# Patient Record
Sex: Female | Born: 1970 | Race: White | Hispanic: No | Marital: Single | Smoking: Former smoker
Health system: Southern US, Community
[De-identification: ages and names within clinical notes are randomized; demographics above are authoritative.]

## PROBLEM LIST (undated history)

## (undated) DIAGNOSIS — J45909 Unspecified asthma, uncomplicated: Secondary | ICD-10-CM

## (undated) DIAGNOSIS — M1990 Unspecified osteoarthritis, unspecified site: Secondary | ICD-10-CM

## (undated) HISTORY — DX: Unspecified asthma, uncomplicated: J45.909

## (undated) HISTORY — PX: TUBAL LIGATION: SHX77

## (undated) HISTORY — DX: Unspecified osteoarthritis, unspecified site: M19.90

---

## 2004-11-12 ENCOUNTER — Emergency Department: Payer: Self-pay | Admitting: Emergency Medicine

## 2007-01-30 LAB — HM PAP SMEAR: HM Pap smear: NORMAL

## 2008-09-25 HISTORY — PX: BREAST BIOPSY: SHX20

## 2008-10-28 ENCOUNTER — Ambulatory Visit: Payer: Self-pay | Admitting: Unknown Physician Specialty

## 2010-06-25 LAB — LIPID PANEL
Cholesterol: 166 mg/dL (ref 0–200)
HDL: 54 mg/dL (ref 35–70)
LDL Cholesterol: 98 mg/dL
LDl/HDL Ratio: 1.8
Triglycerides: 72 mg/dL (ref 40–160)

## 2010-06-25 LAB — BASIC METABOLIC PANEL
BUN: 14 mg/dL (ref 4–21)
Creatinine: 0.7 mg/dL (ref 0.5–1.1)
Glucose: 91 mg/dL
Potassium: 4.6 mmol/L (ref 3.4–5.3)
Sodium: 136 mmol/L — AB (ref 137–147)

## 2010-06-25 LAB — HEPATIC FUNCTION PANEL
ALT: 12 U/L (ref 7–35)
AST: 13 U/L (ref 13–35)
Alkaline Phosphatase: 39 U/L (ref 25–125)
Bilirubin, Total: 0.2 mg/dL

## 2010-06-25 LAB — CBC AND DIFFERENTIAL
HCT: 40 % (ref 36–46)
Hemoglobin: 13.6 g/dL (ref 12.0–16.0)
Neutrophils Absolute: 4 /uL
Platelets: 271 10*3/uL (ref 150–399)
WBC: 6.1 10^3/mL

## 2010-06-25 LAB — TSH: TSH: 0.78 u[IU]/mL (ref 0.41–5.90)

## 2010-09-25 HISTORY — PX: ESSURE TUBAL LIGATION: SUR464

## 2011-05-18 ENCOUNTER — Ambulatory Visit: Payer: Self-pay | Admitting: Obstetrics and Gynecology

## 2011-05-23 ENCOUNTER — Other Ambulatory Visit: Payer: Self-pay | Admitting: *Deleted

## 2011-05-23 MED ORDER — ALBUTEROL SULFATE HFA 108 (90 BASE) MCG/ACT IN AERS: 2.0000 | INHALATION_SPRAY | Freq: Four times a day (QID) | RESPIRATORY_TRACT | Status: DC | PRN

## 2011-05-24 NOTE — Telephone Encounter (Signed)
Rx phoned to pharmacy.  

## 2011-08-03 ENCOUNTER — Other Ambulatory Visit: Payer: Self-pay | Admitting: Internal Medicine

## 2011-11-21 ENCOUNTER — Encounter: Payer: Self-pay | Admitting: Internal Medicine

## 2011-11-21 DIAGNOSIS — J45909 Unspecified asthma, uncomplicated: Secondary | ICD-10-CM | POA: Insufficient documentation

## 2011-11-21 DIAGNOSIS — N879 Dysplasia of cervix uteri, unspecified: Secondary | ICD-10-CM | POA: Insufficient documentation

## 2011-11-22 ENCOUNTER — Encounter: Payer: Self-pay | Admitting: Internal Medicine

## 2011-11-22 ENCOUNTER — Ambulatory Visit (INDEPENDENT_AMBULATORY_CARE_PROVIDER_SITE_OTHER): Payer: Managed Care, Other (non HMO) | Admitting: Internal Medicine

## 2011-11-22 DIAGNOSIS — F17200 Nicotine dependence, unspecified, uncomplicated: Secondary | ICD-10-CM

## 2011-11-22 DIAGNOSIS — Z720 Tobacco use: Secondary | ICD-10-CM | POA: Insufficient documentation

## 2011-11-22 DIAGNOSIS — J45909 Unspecified asthma, uncomplicated: Secondary | ICD-10-CM

## 2011-11-22 DIAGNOSIS — Z0000 Encounter for general adult medical examination without abnormal findings: Secondary | ICD-10-CM | POA: Insufficient documentation

## 2011-11-22 LAB — CBC WITH DIFFERENTIAL/PLATELET
Basophils Absolute: 0 10*3/uL (ref 0.0–0.1)
Basophils Relative: 0.4 % (ref 0.0–3.0)
Eosinophils Absolute: 0.2 10*3/uL (ref 0.0–0.7)
Eosinophils Relative: 3.3 % (ref 0.0–5.0)
HCT: 38.6 % (ref 36.0–46.0)
Hemoglobin: 13 g/dL (ref 12.0–15.0)
Lymphocytes Relative: 22.7 % (ref 12.0–46.0)
Lymphs Abs: 1.5 10*3/uL (ref 0.7–4.0)
MCHC: 33.7 g/dL (ref 30.0–36.0)
MCV: 91.2 fl (ref 78.0–100.0)
Monocytes Absolute: 0.4 10*3/uL (ref 0.1–1.0)
Monocytes Relative: 5.2 % (ref 3.0–12.0)
Neutro Abs: 4.7 10*3/uL (ref 1.4–7.7)
Neutrophils Relative %: 68.4 % (ref 43.0–77.0)
Platelets: 262 10*3/uL (ref 150.0–400.0)
RBC: 4.23 Mil/uL (ref 3.87–5.11)
RDW: 13 % (ref 11.5–14.6)
WBC: 6.8 10*3/uL (ref 4.5–10.5)

## 2011-11-22 LAB — COMPREHENSIVE METABOLIC PANEL
ALT: 20 U/L (ref 0–35)
AST: 18 U/L (ref 0–37)
Albumin: 3.5 g/dL (ref 3.5–5.2)
Alkaline Phosphatase: 36 U/L — ABNORMAL LOW (ref 39–117)
BUN: 12 mg/dL (ref 6–23)
CO2: 20 mEq/L (ref 19–32)
Calcium: 8.6 mg/dL (ref 8.4–10.5)
Chloride: 110 mEq/L (ref 96–112)
Creatinine, Ser: 0.7 mg/dL (ref 0.4–1.2)
GFR: 106.27 mL/min (ref >60.00)
Glucose, Bld: 88 mg/dL (ref 70–99)
Potassium: 4.2 mEq/L (ref 3.5–5.1)
Sodium: 137 mEq/L (ref 135–145)
Total Bilirubin: 0.3 mg/dL (ref 0.3–1.2)
Total Protein: 6.9 g/dL (ref 6.0–8.3)

## 2011-11-22 LAB — LIPID PANEL
Cholesterol: 155 mg/dL (ref 0–200)
HDL: 58.3 mg/dL (ref >39.00)
LDL Cholesterol: 85 mg/dL (ref 0–99)
Total CHOL/HDL Ratio: 3
Triglycerides: 60 mg/dL (ref 0.0–149.0)
VLDL: 12 mg/dL (ref 0.0–40.0)

## 2011-11-22 MED ORDER — ALBUTEROL SULFATE HFA 108 (90 BASE) MCG/ACT IN AERS: 2.0000 | INHALATION_SPRAY | Freq: Four times a day (QID) | RESPIRATORY_TRACT | Status: DC | PRN

## 2011-11-22 NOTE — Assessment & Plan Note (Signed)
Strongly encouraged smoking cessation. Pt planning to quit using e-cigarette for nicotine replacement.

## 2011-11-22 NOTE — Progress Notes (Signed)
Subjective:    Patient ID: Hannah Noble, female    DOB: 11/06/1970, 40 y.o.   MRN: [id number]  HPI 41YO female with h/o asthma presents for her annual exam.  Reports she is generally feeling well. She quit smoking last year x 4 months, then resumed. She is planning to quit again, using electronic cigarette for nicotine replacement.  She follows a fairly healthy diet, based on Weight [employer] and is actively trying to lose weight.  She is also trying to increase physical activity. She believes she is UTD on PAP and last was 12/2010 (need records).  Outpatient Encounter Prescriptions as of 11/22/2011  Medication Sig Dispense Refill  . albuterol (PROAIR HFA) 108 (90 BASE) MCG/ACT inhaler Inhale 2 puffs into the lungs every 6 (six) hours as needed for wheezing.  2 Inhaler  4  . DISCONTD: PROAIR HFA 108 (90 BASE) MCG/ACT inhaler USE 4 TIMES A DAY  2 Inhaler  0    Review of Systems  Constitutional: Negative for fever, chills, appetite change, fatigue and unexpected weight change.  HENT: Negative for ear pain, congestion, sore throat, trouble swallowing, neck pain, voice change and sinus pressure.   Eyes: Negative for visual disturbance.  Respiratory: Negative for cough, shortness of breath, wheezing and stridor.   Cardiovascular: Negative for chest pain, palpitations and leg swelling.  Gastrointestinal: Negative for nausea, vomiting, abdominal pain, diarrhea, constipation, blood in stool, abdominal distention and anal bleeding.  Genitourinary: Negative for dysuria and flank pain.  Musculoskeletal: Negative for myalgias, arthralgias and gait problem.  Skin: Negative for color change and rash.  Neurological: Negative for dizziness and headaches.  Hematological: Negative for adenopathy. Does not bruise/bleed easily.  Psychiatric/Behavioral: Negative for suicidal ideas, sleep disturbance and dysphoric mood. The patient is not nervous/anxious.    BP 122/78  Pulse 82  Temp(Src) 97.7 F (36.5 C)  (Oral)  Ht 5' 8.5" (1.74 m)  Wt 230 lb (104.327 kg)  BMI 34.46 kg/m2  SpO2 97%  LMP 11/14/2011     Objective:   Physical Exam  Constitutional: She is oriented to person, place, and time. She appears well-developed and well-nourished. No distress.  HENT:  Head: Normocephalic and atraumatic.  Right Ear: External ear normal.  Left Ear: External ear normal.  Nose: Nose normal.  Mouth/Throat: Oropharynx is clear and moist. No oropharyngeal exudate.  Eyes: Conjunctivae are normal. Pupils are equal, round, and reactive to light. Right eye exhibits no discharge. Left eye exhibits no discharge. No scleral icterus.  Neck: Normal range of motion. Neck supple. No tracheal deviation present. No thyromegaly present.  Cardiovascular: Normal rate, regular rhythm, normal heart sounds and intact distal pulses.  Exam reveals no gallop and no friction rub.   No murmur heard. Pulmonary/Chest: Effort normal and breath sounds normal. No respiratory distress. She has no wheezes. She has no rales. She exhibits no tenderness. Right breast exhibits no inverted nipple, no mass, no nipple discharge, no skin change and no tenderness. Left breast exhibits no inverted nipple, no mass, no nipple discharge, no skin change and no tenderness.    Abdominal: Soft. She exhibits no distension and no mass. There is no tenderness. There is no rebound and no guarding.  Musculoskeletal: Normal range of motion. She exhibits no edema and no tenderness.  Lymphadenopathy:    She has no cervical adenopathy.  Neurological: She is alert and oriented to person, place, and time. No cranial nerve deficit. She exhibits normal muscle tone. Coordination normal.  Skin: Skin is warm and  dry. No rash noted. She is not diaphoretic. No erythema. No pallor.  Psychiatric: She has a normal mood and affect. Her behavior is normal. Judgment and thought content normal.          Assessment & Plan:

## 2011-11-22 NOTE — Assessment & Plan Note (Signed)
Exam including breast exam normal today. Will get records on last PAP, which pt believes was 12/2010 and was normal.  Will get vaccination record.  Will check CBC, CMP, lipids today. Strongly encouraged smoking cessation.  Follow up 6 months and prn.

## 2011-11-22 NOTE — Assessment & Plan Note (Signed)
Made worse recently with tobacco use. Encouraged smoking cessation.  Plan to continue Albuterol prn, as this provides symptomatic relief for her. Follow up 6 months.

## 2011-12-04 ENCOUNTER — Encounter: Payer: Self-pay | Admitting: Internal Medicine

## 2012-01-11 ENCOUNTER — Encounter: Payer: Self-pay | Admitting: Internal Medicine

## 2012-03-13 ENCOUNTER — Other Ambulatory Visit: Payer: Self-pay | Admitting: *Deleted

## 2012-03-13 MED ORDER — ALBUTEROL SULFATE HFA 108 (90 BASE) MCG/ACT IN AERS: 2.0000 | INHALATION_SPRAY | Freq: Four times a day (QID) | RESPIRATORY_TRACT | Status: DC | PRN

## 2012-04-17 ENCOUNTER — Telehealth: Payer: Self-pay | Admitting: *Deleted

## 2012-04-17 MED ORDER — ALBUTEROL SULFATE HFA 108 (90 BASE) MCG/ACT IN AERS: 2.0000 | INHALATION_SPRAY | Freq: Four times a day (QID) | RESPIRATORY_TRACT | Status: DC | PRN

## 2012-04-17 NOTE — Telephone Encounter (Signed)
rx sent to pharmacy

## 2012-05-22 ENCOUNTER — Ambulatory Visit: Payer: Managed Care, Other (non HMO) | Admitting: Internal Medicine

## 2012-05-29 ENCOUNTER — Encounter: Payer: Self-pay | Admitting: Internal Medicine

## 2012-05-29 ENCOUNTER — Ambulatory Visit (INDEPENDENT_AMBULATORY_CARE_PROVIDER_SITE_OTHER): Payer: Managed Care, Other (non HMO) | Admitting: Internal Medicine

## 2012-05-29 VITALS — BP 100/78 | HR 72 | Temp 98.2°F | Ht 68.5 in | Wt 232.5 lb

## 2012-05-29 DIAGNOSIS — F17200 Nicotine dependence, unspecified, uncomplicated: Secondary | ICD-10-CM

## 2012-05-29 DIAGNOSIS — J45909 Unspecified asthma, uncomplicated: Secondary | ICD-10-CM

## 2012-05-29 DIAGNOSIS — Z720 Tobacco use: Secondary | ICD-10-CM

## 2012-05-29 NOTE — Progress Notes (Signed)
Subjective:    Patient ID: Hannah Noble, female    DOB: March 24, 1971, 41 y.o.   MRN: [id number]  HPI 41 year old female with history of asthma and tobacco abuse presents for followup. She reports that after her last visit she quit smoking for a couple of days but then resumed smoking. She is still interested in quitting. She is not interested in trying Chantix because of potential side effects. She does have an e-cigarette that she plans to use to help wean herself off nicotine. She reports that anxiety and stress dealing with her children make quitting smoking difficult. In regards to her asthma, she reports symptoms have been well controlled with only intermittent use of albuterol. She denies any current chest pain, shortness of breath, or chronic cough.  Outpatient Encounter Prescriptions as of 05/29/2012  Medication Sig Dispense Refill  . albuterol (PROAIR HFA) 108 (90 BASE) MCG/ACT inhaler Inhale 2 puffs into the lungs every 6 (six) hours as needed for wheezing.  2 Inhaler  6   BP 100/78  Pulse 72  Temp 98.2 F (36.8 C) (Oral)  Ht 5' 8.5" (1.74 m)  Wt 232 lb 8 oz (105.461 kg)  BMI 34.84 kg/m2  SpO2 98%  LMP 05/03/2012  Review of Systems  Constitutional: Negative for fever, chills, appetite change, fatigue and unexpected weight change.  HENT: Negative for ear pain, congestion, sore throat, trouble swallowing, neck pain, voice change and sinus pressure.   Eyes: Negative for visual disturbance.  Respiratory: Negative for cough, shortness of breath, wheezing and stridor.   Cardiovascular: Negative for chest pain, palpitations and leg swelling.  Gastrointestinal: Negative for nausea, vomiting, abdominal pain, diarrhea, constipation, blood in stool, abdominal distention and anal bleeding.  Genitourinary: Negative for dysuria and flank pain.  Musculoskeletal: Negative for myalgias, arthralgias and gait problem.  Skin: Negative for color change and rash.  Neurological: Negative for  dizziness and headaches.  Hematological: Negative for adenopathy. Does not bruise/bleed easily.  Psychiatric/Behavioral: Negative for suicidal ideas, disturbed wake/sleep cycle and dysphoric mood. The patient is not nervous/anxious.        Objective:   Physical Exam  Constitutional: She is oriented to person, place, and time. She appears well-developed and well-nourished. No distress.  HENT:  Head: Normocephalic and atraumatic.  Right Ear: External ear normal.  Left Ear: External ear normal.  Nose: Nose normal.  Mouth/Throat: Oropharynx is clear and moist. No oropharyngeal exudate.  Eyes: Conjunctivae are normal. Pupils are equal, round, and reactive to light. Right eye exhibits no discharge. Left eye exhibits no discharge. No scleral icterus.  Neck: Normal range of motion. Neck supple. No tracheal deviation present. No thyromegaly present.  Cardiovascular: Normal rate, regular rhythm, normal heart sounds and intact distal pulses.  Exam reveals no gallop and no friction rub.   No murmur heard. Pulmonary/Chest: Effort normal and breath sounds normal. No respiratory distress. She has no wheezes. She has no rales. She exhibits no tenderness.  Musculoskeletal: Normal range of motion. She exhibits no edema and no tenderness.  Lymphadenopathy:    She has no cervical adenopathy.  Neurological: She is alert and oriented to person, place, and time. No cranial nerve deficit. She exhibits normal muscle tone. Coordination normal.  Skin: Skin is warm and dry. No rash noted. She is not diaphoretic. No erythema. No pallor.  Psychiatric: She has a normal mood and affect. Her behavior is normal. Judgment and thought content normal.          Assessment & Plan:

## 2012-05-29 NOTE — Assessment & Plan Note (Signed)
Symptoms are well controlled with intermittent use of albuterol. Will continue. Encouraged smoking cessation.

## 2012-05-29 NOTE — Assessment & Plan Note (Signed)
Encourage smoking cessation. Discussed techniques to help with smoking cessation including use of Wellbutrin and Chantix. We also discussed nicotine replacement. Discussed setting a quit date. Patient will call if she is interested in any of these options.

## 2012-09-16 ENCOUNTER — Telehealth: Payer: Self-pay | Admitting: Internal Medicine

## 2012-09-16 MED ORDER — ALBUTEROL SULFATE HFA 108 (90 BASE) MCG/ACT IN AERS: 2.0000 | INHALATION_SPRAY | Freq: Four times a day (QID) | RESPIRATORY_TRACT | Status: DC | PRN

## 2012-09-16 NOTE — Telephone Encounter (Signed)
Will need to call in Rx.  Maybe Ventolin would be less expensive?

## 2012-09-16 NOTE — Telephone Encounter (Signed)
Called LM with mother regarding medication. Advised her to have pt call back.

## 2012-09-16 NOTE — Telephone Encounter (Signed)
Pt is calling Proair ?? Insurance does 90 day supply and she is on her last one and she can't refill until it until the end of January. She is getting concerned. Please give her a call back or if we have samples.

## 2012-09-16 NOTE — Telephone Encounter (Signed)
Med filled.  

## 2012-09-16 NOTE — Telephone Encounter (Signed)
Please advise we do not have any samples.

## 2012-11-09 ENCOUNTER — Other Ambulatory Visit: Payer: Self-pay

## 2012-11-26 ENCOUNTER — Encounter: Payer: Managed Care, Other (non HMO) | Admitting: Internal Medicine

## 2013-03-04 ENCOUNTER — Telehealth: Payer: Self-pay | Admitting: Internal Medicine

## 2013-03-04 NOTE — Telephone Encounter (Signed)
Needing a sample of pro air

## 2013-03-04 NOTE — Telephone Encounter (Signed)
Left sample of Ventolin up front for patient to pick up.

## 2013-03-11 ENCOUNTER — Ambulatory Visit (INDEPENDENT_AMBULATORY_CARE_PROVIDER_SITE_OTHER): Payer: Managed Care, Other (non HMO) | Admitting: Adult Health

## 2013-03-11 ENCOUNTER — Encounter: Payer: Self-pay | Admitting: Adult Health

## 2013-03-11 VITALS — BP 110/66 | HR 72 | Resp 12 | Wt 241.0 lb

## 2013-03-11 DIAGNOSIS — J45901 Unspecified asthma with (acute) exacerbation: Secondary | ICD-10-CM

## 2013-03-11 DIAGNOSIS — J4541 Moderate persistent asthma with (acute) exacerbation: Secondary | ICD-10-CM

## 2013-03-11 DIAGNOSIS — J45909 Unspecified asthma, uncomplicated: Secondary | ICD-10-CM

## 2013-03-11 DIAGNOSIS — Z720 Tobacco use: Secondary | ICD-10-CM

## 2013-03-11 DIAGNOSIS — J4540 Moderate persistent asthma, uncomplicated: Secondary | ICD-10-CM

## 2013-03-11 DIAGNOSIS — F17200 Nicotine dependence, unspecified, uncomplicated: Secondary | ICD-10-CM

## 2013-03-11 MED ORDER — FLUTICASONE-SALMETEROL 100-50 MCG/DOSE IN AEPB: 1.0000 | INHALATION_SPRAY | Freq: Two times a day (BID) | RESPIRATORY_TRACT | Status: DC

## 2013-03-11 MED ORDER — MONTELUKAST SODIUM 10 MG PO TABS: 10.0000 mg | ORAL_TABLET | Freq: Every day | ORAL | Status: DC

## 2013-03-11 NOTE — Patient Instructions (Addendum)
  I am referring you to a lung specialist for your asthma. They will contact you with an appointment.  I have prescribed a long acting inhaler (Advair) to help better control your symptoms. Use this twice daily - morning and evening. Rinse well after use. If you are having an asthma attack do not use this inhaler. Use your ProAir.  I am also starting you on singulair. Take one at bedtime daily.

## 2013-03-11 NOTE — Progress Notes (Signed)
  Subjective:    Patient ID: Gunnar Fusi, female    DOB: 1970-10-23, 42 y.o.   MRN: [id number]  HPI  Patient is a pleasant 42 year old female who presents to clinic with reports of using her rescue inhaler "too much". She has been using her pro air approximately 6 times daily. Sometimes she awakens in the middle of the night and feels short of breath and uses it then as well. Patient had run out of her inhaler and became very anxious. Patient has a history of asthma however her account is very vague. She first reported that she did not have asthma; however, in reviewing her medical records, asthma is listed as one of her medical problems. She denies having asthma as a child. She reports seeing a physician in the past for pulmonary function tests but believes this was greater than 5 years ago. She does not recall specific information about this test. She recalls that she was on Advair years ago, however, she is currently not on any long-acting inhaler. Patient reports that this spring she experienced severe allergies for the first time. These allergies significantly affected her asthma. She started to take Zyrtec and has felt some improvement pertaining to her allergies. Patient has been a long-time smoker but successfully quit this past January.   Current Outpatient Prescriptions on File Prior to Visit  Medication Sig Dispense Refill  . albuterol (PROAIR HFA) 108 (90 BASE) MCG/ACT inhaler Inhale 2 puffs into the lungs every 6 (six) hours as needed for wheezing.  2 Inhaler  6   No current facility-administered medications on file prior to visit.    Review of Systems  Constitutional: Negative for fever and chills.  HENT: Positive for rhinorrhea and postnasal drip. Negative for sore throat and sinus pressure.   Eyes: Negative.   Respiratory: Positive for cough, shortness of breath and wheezing.   Cardiovascular: Negative for chest pain and palpitations.  Allergic/Immunologic: Positive for  environmental allergies.     BP 110/66  Pulse 72  Resp 12  Wt 241 lb (109.317 kg)  BMI 36.11 kg/m2  SpO2 97%    Objective:   Physical Exam  Constitutional: She is oriented to person, place, and time.  Overweight, pleasant female in no apparent distress.  HENT:  Head: Normocephalic and atraumatic.  Right Ear: External ear normal.  Left Ear: External ear normal.  Nose: Nose normal.  Mouth/Throat: Oropharynx is clear and moist.  Cardiovascular: Normal rate, regular rhythm and normal heart sounds.  Exam reveals no gallop and no friction rub.   No murmur heard. Pulmonary/Chest: Effort normal. No respiratory distress. She has wheezes. She has no rales.  Neurological: She is alert and oriented to person, place, and time.  Skin: Skin is warm and dry.  Psychiatric: She has a normal mood and affect. Her behavior is normal.  Patient, at times, appears to be a poor historian. Vague account of her medical history.          Assessment & Plan:

## 2013-03-11 NOTE — Assessment & Plan Note (Addendum)
Patient's asthma appears to be poorly controlled. Allergies during spring exacerbated symptoms. Increasing use of short-acting inhaler. Start Advair. Provided patient with sample. Instructed to rinse after each use. She has been taking Zyrtec for allergies with good results. Start Singulair. Refer to Pulmonology. Patient has had PFTs greater than 5 years ago. Note, greater than 25 min were spent in face to face communication with patient in the assessment, planning and implementation of care pertaining to this problem.

## 2013-03-11 NOTE — Assessment & Plan Note (Signed)
History of. Patient quit in January 2014. No tobacco use since. Congratulated on significant accomplishment.

## 2013-03-25 ENCOUNTER — Ambulatory Visit (INDEPENDENT_AMBULATORY_CARE_PROVIDER_SITE_OTHER): Payer: Managed Care, Other (non HMO) | Admitting: Pulmonary Disease

## 2013-03-25 ENCOUNTER — Encounter: Payer: Self-pay | Admitting: Pulmonary Disease

## 2013-03-25 VITALS — BP 110/68 | HR 77 | Temp 98.7°F | Ht 69.0 in | Wt 246.0 lb

## 2013-03-25 DIAGNOSIS — F17200 Nicotine dependence, unspecified, uncomplicated: Secondary | ICD-10-CM

## 2013-03-25 DIAGNOSIS — J4540 Moderate persistent asthma, uncomplicated: Secondary | ICD-10-CM

## 2013-03-25 DIAGNOSIS — J45909 Unspecified asthma, uncomplicated: Secondary | ICD-10-CM

## 2013-03-25 DIAGNOSIS — Z720 Tobacco use: Secondary | ICD-10-CM

## 2013-03-25 MED ORDER — FLUNISOLIDE HFA 80 MCG/ACT IN AERS: 2.0000 | INHALATION_SPRAY | Freq: Two times a day (BID) | RESPIRATORY_TRACT | Status: DC

## 2013-03-25 MED ORDER — ALBUTEROL SULFATE HFA 108 (90 BASE) MCG/ACT IN AERS: 2.0000 | INHALATION_SPRAY | Freq: Four times a day (QID) | RESPIRATORY_TRACT | Status: DC | PRN

## 2013-03-25 MED ORDER — ENOXAPARIN SODIUM 30 MG/0.3ML ~~LOC~~ SOLN: 40.0000 mg | Freq: Every day | SUBCUTANEOUS | Status: DC

## 2013-03-25 NOTE — Patient Instructions (Addendum)
We will send you for a Chest X-ray and call you with the results.  Use the Aerospan inhaler 2 puffs twice a day  We will see you back in 4-6 weeks or sooner if needed

## 2013-03-25 NOTE — Assessment & Plan Note (Addendum)
Hannah Noble describes persistent symptoms, at least moderate persistent based on her every other day albuterol use and one to 2 nighttime symptoms a week. I suspect that the years of smoking contributed to the recent escalation in symptoms though I congratulated her on quitting. It is also likely that a URI she contracted in April 2014 contributed as well.  Plan: -Start flunisolide inhaled twice a day -Continue when necessary albuterol -Stay away from cigarettes -Check asthma control questionnaire on next visit. Is still poorly controlled then increase to combination inhaler and consider allergy evaluation versus checking serum IgE level -Check a chest x-ray

## 2013-03-25 NOTE — Progress Notes (Signed)
Subjective:    Patient ID: Hannah Noble, female    DOB: 04-02-1971, 42 y.o.   MRN: [id number]  HPI  This is a 42 year old female who likely has asthma who comes her clinic today to establish care for the same. She had a normal childhood without respiratory illnesses but as a teenager she believes she was diagnosed as having asthma. At some point as a young adult she was placed on Advair which helped with her symptoms. Unfortunately she started smoking as a teenager and smoked for to the one pack per day for at least 20 years and quit in January 2014. Since then she has remained off of all cigarettes. In April 2014 she had an upper respiratory infection while she was visiting friends in [place]. This led to cough, chest tightness, shortness of breath. She also had what she felt like were allergy symptoms at the time. She is seen by an urgent care facility and treated (unclear exactly what she was given) and she started using Zyrtec at this time. She said that this helped with the allergy symptoms. However since April she's had more breathing trouble than normal. She's had use her albuterol multiple times per day and she has nighttime symptoms it least one to 2 times a week requiring her to wake up and use albuterol. Typically, she says that she has to use her albuterol every other day. She was started on Advair prior primary care physician a few weeks ago and she says that this is helped. However she was only given 2 weeks of a sample so she is currently not taking it. Her albuterol use improved greatly while on Advair but then steadily in the last week she's had to use more of it to the point where she is now using albuterol at least every other day. She has had one or 2 nighttime symptoms in the past week as well. She has some wheezing but denies cough or chest pain now. She states that she can't get a deep breath as often as she would like. She denies ongoing sinus symptoms.   Past Medical History   Diagnosis Date  . Asthma      Family History  Problem Relation Age of Onset  . COPD Father   . Cancer Maternal Grandfather     skin  . Cancer Paternal Grandmother     lung     History   Social History  . Marital Status: Divorced    Spouse Name: N/A    Number of Children: 2  . Years of Education: N/A   Occupational History  .  [employer]   Social History Main Topics  . Smoking status: Former Smoker -- 0.50 packs/day    Types: Cigarettes    Quit date: 09/25/2012  . Smokeless tobacco: Not on file  . Alcohol Use: Yes     Comment: Rarely  . Drug Use: No  . Sexually Active: Not on file   Other Topics Concern  . Not on file   Social History Narrative   Lives in [place] with children 14YO and 8YO. Works at [employer] [place].      Regular Exercise -  Yes, walk 1 to 3 times a week 30 min   Daily Caffeine Use:  Diet pepsi 1-2              No Known Allergies   Outpatient Prescriptions Prior to Visit  Medication Sig Dispense Refill  . albuterol (PROAIR HFA) 108 (90  BASE) MCG/ACT inhaler Inhale 2 puffs into the lungs every 6 (six) hours as needed for wheezing.  2 Inhaler  6  . Fluticasone-Salmeterol (ADVAIR) 100-50 MCG/DOSE AEPB Inhale 1 puff into the lungs 2 (two) times daily.  1 each  3  . montelukast (SINGULAIR) 10 MG tablet Take 1 tablet (10 mg total) by mouth at bedtime.  30 tablet  3  . cetirizine (ZYRTEC) 10 MG tablet Take 10 mg by mouth daily.       No facility-administered medications prior to visit.       Review of Systems  Constitutional: Negative for fever, chills, diaphoresis, activity change, appetite change, fatigue and unexpected weight change.  HENT: Positive for congestion, sneezing and sinus pressure. Negative for hearing loss, ear pain, nosebleeds, sore throat, facial swelling, rhinorrhea, mouth sores, trouble swallowing, neck pain, neck stiffness, dental problem, voice change, postnasal drip, tinnitus and ear discharge.   Eyes: Negative  for photophobia, discharge, itching and visual disturbance.  Respiratory: Positive for cough, chest tightness and shortness of breath. Negative for apnea, choking, wheezing and stridor.   Cardiovascular: Negative for chest pain, palpitations and leg swelling.  Gastrointestinal: Negative for nausea, vomiting, abdominal pain, constipation, blood in stool and abdominal distention.  Genitourinary: Negative for dysuria, urgency, frequency, hematuria, flank pain, decreased urine volume and difficulty urinating.  Musculoskeletal: Negative for myalgias, back pain, joint swelling, arthralgias and gait problem.  Skin: Negative for color change, pallor and rash.  Neurological: Negative for dizziness, tremors, seizures, syncope, speech difficulty, weakness, light-headedness, numbness and headaches.  Hematological: Negative for adenopathy. Does not bruise/bleed easily.  Psychiatric/Behavioral: Negative for confusion, sleep disturbance and agitation. The patient is not nervous/anxious.        Objective:   Physical Exam  Filed Vitals:   03/25/13 1606  BP: 110/68  Pulse: 77  Temp: 98.7 F (37.1 C)  TempSrc: Oral  Height: 5\' 9"  (1.753 m)  Weight: 246 lb (111.585 kg)  SpO2: 97%    Gen: well appearing, no acute distress HEENT: NCAT, PERRL, EOMi, OP clear, neck supple without masses PULM: mild exp wheeze R base, otherwise clear; normal percussion CV: RRR, no mgr, no JVD AB: BS+, soft, nontender, no hsm Ext: warm, no edema, no clubbing, no cyanosis Derm: no rash or skin breakdown Neuro: A&Ox4, CN II-XII intact, strength 5/5 in all 4 extremities      Assessment & Plan:   Asthma in adult Hannah Noble describes persistent symptoms, at least moderate persistent based on her every other day albuterol use and one to 2 nighttime symptoms a week. I suspect that the years of smoking contributed to the recent escalation in symptoms though I congratulated her on quitting. It is also likely that a URI she  contracted in April 2014 contributed as well.  Plan: -Start flunisolide inhaled twice a day -Continue when necessary albuterol -Stay away from cigarettes -Check asthma control questionnaire on next visit. Is still poorly controlled then increase to combination inhaler and consider allergy evaluation versus checking serum IgE level -Check a chest x-ray  Tobacco abuse I congratulated her on quitting. We discussed the risks of using an E. Cigarettes as she has considered this. I advised that she not start using them as she has been nicotine free for over 5 months now and it is likely that the E. cigarettes would lead to recurrent nicotine dependence.   Updated Medication List Outpatient Encounter Prescriptions as of 03/25/2013  Medication Sig Dispense Refill  . albuterol (PROAIR HFA) 108 (90 BASE) MCG/ACT  inhaler Inhale 2 puffs into the lungs every 6 (six) hours as needed for wheezing.  2 Inhaler  6  . montelukast (SINGULAIR) 10 MG tablet Take 1 tablet (10 mg total) by mouth at bedtime.  30 tablet  3  . [DISCONTINUED] albuterol (PROAIR HFA) 108 (90 BASE) MCG/ACT inhaler Inhale 2 puffs into the lungs every 6 (six) hours as needed for wheezing.  2 Inhaler  6  . [DISCONTINUED] Fluticasone-Salmeterol (ADVAIR) 100-50 MCG/DOSE AEPB Inhale 1 puff into the lungs 2 (two) times daily.  1 each  3  . cetirizine (ZYRTEC) 10 MG tablet Take 10 mg by mouth daily.      . Flunisolide HFA 80 MCG/ACT AERS Inhale 2 Inhalers into the lungs 2 (two) times daily.  1 Inhaler  5  . [DISCONTINUED] enoxaparin (LOVENOX) 30 MG/0.3ML injection Inject 0.4 mLs (40 mg total) into the skin daily.  10 Syringe  0   No facility-administered encounter medications on file as of 03/25/2013.

## 2013-03-25 NOTE — Assessment & Plan Note (Signed)
I congratulated her on quitting. We discussed the risks of using an E. Cigarettes as she has considered this. I advised that she not start using them as she has been nicotine free for over 5 months now and it is likely that the E. cigarettes would lead to recurrent nicotine dependence.

## 2013-03-26 ENCOUNTER — Encounter: Payer: Self-pay | Admitting: Pulmonary Disease

## 2013-03-31 ENCOUNTER — Ambulatory Visit
Admission: RE | Admit: 2013-03-31 | Discharge: 2013-03-31 | Disposition: A | Discharge status: Home / Self Care | Payer: Managed Care, Other (non HMO) | Source: Ambulatory Visit | Attending: Pulmonary Disease | Admitting: Pulmonary Disease

## 2013-03-31 ENCOUNTER — Ambulatory Visit (INDEPENDENT_AMBULATORY_CARE_PROVIDER_SITE_OTHER)
Admission: RE | Admit: 2013-03-31 | Discharge: 2013-03-31 | Disposition: A | Discharge status: Home / Self Care | Payer: Managed Care, Other (non HMO) | Source: Ambulatory Visit | Attending: Pulmonary Disease | Admitting: Pulmonary Disease

## 2013-03-31 DIAGNOSIS — J45909 Unspecified asthma, uncomplicated: Secondary | ICD-10-CM

## 2013-04-01 ENCOUNTER — Institutional Professional Consult (permissible substitution): Payer: Managed Care, Other (non HMO) | Admitting: Pulmonary Disease

## 2013-04-01 ENCOUNTER — Ambulatory Visit: Payer: Managed Care, Other (non HMO) | Admitting: Internal Medicine

## 2013-07-15 ENCOUNTER — Ambulatory Visit (INDEPENDENT_AMBULATORY_CARE_PROVIDER_SITE_OTHER): Payer: Managed Care, Other (non HMO) | Admitting: Internal Medicine

## 2013-07-15 ENCOUNTER — Other Ambulatory Visit (HOSPITAL_COMMUNITY)
Admission: RE | Admit: 2013-07-15 | Discharge: 2013-07-15 | Disposition: A | Discharge status: Home / Self Care | Payer: Managed Care, Other (non HMO) | Source: Ambulatory Visit | Attending: Internal Medicine | Admitting: Internal Medicine

## 2013-07-15 ENCOUNTER — Encounter: Payer: Self-pay | Admitting: Internal Medicine

## 2013-07-15 VITALS — BP 118/78 | HR 68 | Temp 98.2°F | Ht 69.0 in | Wt 236.0 lb

## 2013-07-15 DIAGNOSIS — Z1151 Encounter for screening for human papillomavirus (HPV): Secondary | ICD-10-CM | POA: Insufficient documentation

## 2013-07-15 DIAGNOSIS — N760 Acute vaginitis: Secondary | ICD-10-CM | POA: Insufficient documentation

## 2013-07-15 DIAGNOSIS — Z01419 Encounter for gynecological examination (general) (routine) without abnormal findings: Secondary | ICD-10-CM | POA: Insufficient documentation

## 2013-07-15 DIAGNOSIS — Z113 Encounter for screening for infections with a predominantly sexual mode of transmission: Secondary | ICD-10-CM | POA: Insufficient documentation

## 2013-07-15 DIAGNOSIS — E669 Obesity, unspecified: Secondary | ICD-10-CM | POA: Insufficient documentation

## 2013-07-15 DIAGNOSIS — Z0000 Encounter for general adult medical examination without abnormal findings: Secondary | ICD-10-CM

## 2013-07-15 LAB — COMPREHENSIVE METABOLIC PANEL
ALT: 16 U/L (ref 0–35)
AST: 18 U/L (ref 0–37)
Albumin: 3.8 g/dL (ref 3.5–5.2)
Alkaline Phosphatase: 30 U/L — ABNORMAL LOW (ref 39–117)
BUN: 12 mg/dL (ref 6–23)
CO2: 22 mEq/L (ref 19–32)
Calcium: 8.9 mg/dL (ref 8.4–10.5)
Chloride: 104 mEq/L (ref 96–112)
Creatinine, Ser: 0.7 mg/dL (ref 0.4–1.2)
GFR: 95.3 mL/min (ref >60.00)
Glucose, Bld: 91 mg/dL (ref 70–99)
Potassium: 4.3 mEq/L (ref 3.5–5.1)
Sodium: 136 mEq/L (ref 135–145)
Total Bilirubin: 0.7 mg/dL (ref 0.3–1.2)
Total Protein: 7.4 g/dL (ref 6.0–8.3)

## 2013-07-15 LAB — CBC WITH DIFFERENTIAL/PLATELET
Basophils Absolute: 0 10*3/uL (ref 0.0–0.1)
Basophils Relative: 0.3 % (ref 0.0–3.0)
Eosinophils Absolute: 0.1 10*3/uL (ref 0.0–0.7)
Eosinophils Relative: 1.8 % (ref 0.0–5.0)
HCT: 37.8 % (ref 36.0–46.0)
Hemoglobin: 12.9 g/dL (ref 12.0–15.0)
Lymphocytes Relative: 22.7 % (ref 12.0–46.0)
Lymphs Abs: 1.5 10*3/uL (ref 0.7–4.0)
MCHC: 34 g/dL (ref 30.0–36.0)
MCV: 88.8 fl (ref 78.0–100.0)
Monocytes Absolute: 0.4 10*3/uL (ref 0.1–1.0)
Monocytes Relative: 5.7 % (ref 3.0–12.0)
Neutro Abs: 4.5 10*3/uL (ref 1.4–7.7)
Neutrophils Relative %: 69.5 % (ref 43.0–77.0)
Platelets: 271 10*3/uL (ref 150.0–400.0)
RBC: 4.25 Mil/uL (ref 3.87–5.11)
RDW: 14.2 % (ref 11.5–14.6)
WBC: 6.4 10*3/uL (ref 4.5–10.5)

## 2013-07-15 LAB — MICROALBUMIN / CREATININE URINE RATIO
Creatinine,U: 95.2 mg/dL
Microalb Creat Ratio: 0.8 mg/g (ref 0.0–30.0)
Microalb, Ur: 0.8 mg/dL (ref 0.0–1.9)

## 2013-07-15 LAB — LIPID PANEL
Cholesterol: 155 mg/dL (ref 0–200)
HDL: 53.1 mg/dL (ref >39.00)
LDL Cholesterol: 91 mg/dL (ref 0–99)
Total CHOL/HDL Ratio: 3
Triglycerides: 56 mg/dL (ref 0.0–149.0)
VLDL: 11.2 mg/dL (ref 0.0–40.0)

## 2013-07-15 LAB — HM PAP SMEAR: HM Pap smear: NEGATIVE

## 2013-07-15 LAB — TSH: TSH: 0.81 u[IU]/mL (ref 0.35–5.50)

## 2013-07-15 MED ORDER — ALBUTEROL SULFATE HFA 108 (90 BASE) MCG/ACT IN AERS: 2.0000 | INHALATION_SPRAY | Freq: Four times a day (QID) | RESPIRATORY_TRACT | Status: DC | PRN

## 2013-07-15 NOTE — Assessment & Plan Note (Signed)
General medical exam including breast and pelvic exam normal except as noted. PAP pending. Mammogram ordered. Flu vaccine declined. Will check labs including CBC, CMP, lipids, TSH, Vit D. Will screen for STDs with PAP and will check HIV, RPR with labs. Encouraged healthy diet and regular exercise with goal of weight loss. Congratulated pt on quitting smoking. Follow up 1 year and prn.

## 2013-07-15 NOTE — Progress Notes (Signed)
Subjective:    Patient ID: Hannah Noble, female    DOB: 13-Jun-1971, 42 y.o.   MRN: [id number]  HPI 42 year old female with history of asthma presents for annual exam. She reports she is feeling well. She quit smoking in January 2014. She notes some weight gain after stopping smoking. She is trying to follow a healthy diet and recently started Weight [employer]chers. She is trying to increase her physical activity. She denies any concerns today.  Outpatient Prescriptions Prior to Visit  Medication Sig Dispense Refill  . Flunisolide HFA 80 MCG/ACT AERS Inhale 2 Inhalers into the lungs 2 (two) times daily.  1 Inhaler  5  . albuterol (PROAIR HFA) 108 (90 BASE) MCG/ACT inhaler Inhale 2 puffs into the lungs every 6 (six) hours as needed for wheezing.  2 Inhaler  6  . cetirizine (ZYRTEC) 10 MG tablet Take 10 mg by mouth daily.      . montelukast (SINGULAIR) 10 MG tablet Take 1 tablet (10 mg total) by mouth at bedtime.  30 tablet  3   No facility-administered medications prior to visit.    Review of Systems  Constitutional: Negative for fever, chills, appetite change, fatigue and unexpected weight change.  HENT: Negative for congestion, ear pain, sinus pressure, sore throat, trouble swallowing and voice change.   Eyes: Negative for visual disturbance.  Respiratory: Negative for cough, shortness of breath, wheezing and stridor.   Cardiovascular: Negative for chest pain, palpitations and leg swelling.  Gastrointestinal: Negative for nausea, vomiting, abdominal pain, diarrhea, constipation, blood in stool, abdominal distention and anal bleeding.  Genitourinary: Negative for dysuria and flank pain.  Musculoskeletal: Negative for arthralgias, gait problem, myalgias and neck pain.  Skin: Negative for color change and rash.  Neurological: Negative for dizziness and headaches.  Hematological: Negative for adenopathy. Does not bruise/bleed easily.  Psychiatric/Behavioral: Negative for suicidal ideas, sleep  disturbance and dysphoric mood. The patient is not nervous/anxious.        Objective:   Physical Exam  Constitutional: She is oriented to person, place, and time. She appears well-developed and well-nourished. No distress.  HENT:  Head: Normocephalic and atraumatic.  Right Ear: External ear normal.  Left Ear: External ear normal.  Nose: Nose normal.  Mouth/Throat: Oropharynx is clear and moist. No oropharyngeal exudate.  Eyes: Conjunctivae are normal. Pupils are equal, round, and reactive to light. Right eye exhibits no discharge. Left eye exhibits no discharge. No scleral icterus.  Neck: Normal range of motion. Neck supple. No tracheal deviation present. No thyromegaly present.  Cardiovascular: Normal rate, regular rhythm, normal heart sounds and intact distal pulses.  Exam reveals no gallop and no friction rub.   No murmur heard. Pulmonary/Chest: Effort normal and breath sounds normal. No respiratory distress. She has no wheezes. She has no rales. She exhibits no tenderness.  Abdominal: Soft. Bowel sounds are normal. She exhibits no distension and no mass. There is no tenderness. There is no rebound and no guarding.  Genitourinary: Rectum normal, vagina normal and uterus normal. No breast swelling, tenderness, discharge or bleeding. Pelvic exam was performed with patient supine. There is rash (erythematous papules hair follicles right > left groin) on the right labia. There is no tenderness or lesion on the right labia. There is rash on the left labia. There is no tenderness or lesion on the left labia. Uterus is not enlarged and not tender. Cervix exhibits discharge (scant white) and friability. Cervix exhibits no motion tenderness. Right adnexum displays no mass, no tenderness and no  fullness. Left adnexum displays no mass, no tenderness and no fullness. No erythema or tenderness around the vagina. No vaginal discharge found.  Musculoskeletal: Normal range of motion. She exhibits no edema and  no tenderness.  Lymphadenopathy:    She has no cervical adenopathy.  Neurological: She is alert and oriented to person, place, and time. No cranial nerve deficit. She exhibits normal muscle tone. Coordination normal.  Skin: Skin is warm and dry. No rash noted. She is not diaphoretic. No erythema. No pallor.  Psychiatric: She has a normal mood and affect. Her behavior is normal. Judgment and thought content normal.          Assessment & Plan:

## 2013-07-15 NOTE — Assessment & Plan Note (Signed)
Encouraged healthy, Mediterranean style diet, and regular exercise with goal of weight loss.

## 2013-07-16 LAB — VITAMIN D 25 HYDROXY (VIT D DEFICIENCY, FRACTURES): Vit D, 25-Hydroxy: 28 ng/mL — ABNORMAL LOW (ref 30–89)

## 2013-07-16 LAB — RPR

## 2013-07-16 LAB — HIV ANTIBODY (ROUTINE TESTING W REFLEX): HIV: NONREACTIVE

## 2013-07-21 ENCOUNTER — Telehealth: Payer: Self-pay | Admitting: *Deleted

## 2013-07-21 MED ORDER — METRONIDAZOLE 500 MG PO TABS: 500.0000 mg | ORAL_TABLET | Freq: Two times a day (BID) | ORAL | Status: DC

## 2013-07-21 NOTE — Telephone Encounter (Signed)
Rx sent to pharmacy on file and patient is aware

## 2013-07-21 NOTE — Telephone Encounter (Signed)
Message copied by Theola Sequin on Mon Jul 21, 2013  9:50 AM ------      Message from: Ronna Polio A      Created: Fri Jul 18, 2013  8:22 AM       Additional culture on PAP was positive for bacterial vaginosis. I would like to start metronidazole 500mg  twice daily x 1 week. ------

## 2013-07-31 ENCOUNTER — Other Ambulatory Visit: Payer: Self-pay

## 2013-10-29 ENCOUNTER — Telehealth: Payer: Self-pay | Admitting: Internal Medicine

## 2013-10-29 DIAGNOSIS — Z0279 Encounter for issue of other medical certificate: Secondary | ICD-10-CM

## 2013-10-29 NOTE — Telephone Encounter (Signed)
Pt dropped off 2015 physician results form to be filled out In box

## 2013-10-31 NOTE — Telephone Encounter (Signed)
Form has been completed and faxed back.

## 2014-03-16 ENCOUNTER — Other Ambulatory Visit: Payer: Self-pay | Admitting: Internal Medicine

## 2014-07-07 ENCOUNTER — Encounter: Payer: Managed Care, Other (non HMO) | Admitting: Internal Medicine

## 2014-07-21 ENCOUNTER — Encounter: Payer: Managed Care, Other (non HMO) | Admitting: Internal Medicine

## 2014-08-19 ENCOUNTER — Encounter: Payer: Managed Care, Other (non HMO) | Admitting: Internal Medicine

## 2014-09-12 ENCOUNTER — Other Ambulatory Visit: Payer: Self-pay | Admitting: Internal Medicine

## 2014-09-12 NOTE — Telephone Encounter (Signed)
Pt hasn't been seen since 06/25/13-okay to refill?

## 2014-09-14 ENCOUNTER — Telehealth: Payer: Self-pay | Admitting: Internal Medicine

## 2014-09-14 NOTE — Telephone Encounter (Signed)
Pt notified this was refilled 09/13/14 and to contact her pharmacy,  verbalized understanding

## 2014-09-14 NOTE — Telephone Encounter (Signed)
Ms. Lenderman called saying when [facility] on her ProAir she heard from the automated service that she is out of refills. She's going out of town this Wednesday and is wondering if a Rx can be sent to her pharmacy before then so she can get that before her next appt with Dr. Gilford Rile. Please call the pt.  Pt ph# [phone] Thank you.

## 2014-09-30 ENCOUNTER — Ambulatory Visit (INDEPENDENT_AMBULATORY_CARE_PROVIDER_SITE_OTHER): Payer: BLUE CROSS/BLUE SHIELD | Admitting: Internal Medicine

## 2014-09-30 ENCOUNTER — Encounter: Payer: Self-pay | Admitting: Internal Medicine

## 2014-09-30 VITALS — BP 118/78 | HR 70 | Temp 98.1°F | Ht 69.0 in | Wt 244.0 lb

## 2014-09-30 DIAGNOSIS — Z0000 Encounter for general adult medical examination without abnormal findings: Secondary | ICD-10-CM

## 2014-09-30 DIAGNOSIS — Z1239 Encounter for other screening for malignant neoplasm of breast: Secondary | ICD-10-CM

## 2014-09-30 DIAGNOSIS — E669 Obesity, unspecified: Secondary | ICD-10-CM

## 2014-09-30 DIAGNOSIS — J4530 Mild persistent asthma, uncomplicated: Secondary | ICD-10-CM

## 2014-09-30 LAB — CBC WITH DIFFERENTIAL/PLATELET
Basophils Absolute: 0 10*3/uL (ref 0.0–0.1)
Basophils Relative: 0.5 % (ref 0.0–3.0)
Eosinophils Absolute: 0.2 10*3/uL (ref 0.0–0.7)
Eosinophils Relative: 3.4 % (ref 0.0–5.0)
HCT: 38.4 % (ref 36.0–46.0)
Hemoglobin: 12.8 g/dL (ref 12.0–15.0)
Lymphocytes Relative: 22.5 % (ref 12.0–46.0)
Lymphs Abs: 1.2 10*3/uL (ref 0.7–4.0)
MCHC: 33.2 g/dL (ref 30.0–36.0)
MCV: 89.3 fl (ref 78.0–100.0)
Monocytes Absolute: 0.4 10*3/uL (ref 0.1–1.0)
Monocytes Relative: 7.3 % (ref 3.0–12.0)
Neutro Abs: 3.5 10*3/uL (ref 1.4–7.7)
Neutrophils Relative %: 66.3 % (ref 43.0–77.0)
Platelets: 275 10*3/uL (ref 150.0–400.0)
RBC: 4.3 Mil/uL (ref 3.87–5.11)
RDW: 13.6 % (ref 11.5–15.5)
WBC: 5.3 10*3/uL (ref 4.0–10.5)

## 2014-09-30 LAB — MICROALBUMIN / CREATININE URINE RATIO
Creatinine,U: 107.3 mg/dL
Microalb Creat Ratio: 0.3 mg/g (ref 0.0–30.0)
Microalb, Ur: 0.3 mg/dL (ref 0.0–1.9)

## 2014-09-30 LAB — LIPID PANEL
Cholesterol: 179 mg/dL (ref 0–200)
HDL: 60.6 mg/dL (ref >39.00)
LDL Cholesterol: 101 mg/dL — ABNORMAL HIGH (ref 0–99)
NonHDL: 118.4
Total CHOL/HDL Ratio: 3
Triglycerides: 86 mg/dL (ref 0.0–149.0)
VLDL: 17.2 mg/dL (ref 0.0–40.0)

## 2014-09-30 LAB — COMPREHENSIVE METABOLIC PANEL
ALT: 23 U/L (ref 0–35)
AST: 21 U/L (ref 0–37)
Albumin: 3.7 g/dL (ref 3.5–5.2)
Alkaline Phosphatase: 37 U/L — ABNORMAL LOW (ref 39–117)
BUN: 12 mg/dL (ref 6–23)
CO2: 24 mEq/L (ref 19–32)
Calcium: 8.8 mg/dL (ref 8.4–10.5)
Chloride: 107 mEq/L (ref 96–112)
Creatinine, Ser: 0.7 mg/dL (ref 0.4–1.2)
GFR: 99.5 mL/min (ref >60.00)
Glucose, Bld: 89 mg/dL (ref 70–99)
Potassium: 4.5 mEq/L (ref 3.5–5.1)
Sodium: 138 mEq/L (ref 135–145)
Total Bilirubin: 0.6 mg/dL (ref 0.2–1.2)
Total Protein: 6.8 g/dL (ref 6.0–8.3)

## 2014-09-30 LAB — TSH: TSH: 1.31 u[IU]/mL (ref 0.35–4.50)

## 2014-09-30 LAB — VITAMIN D 25 HYDROXY (VIT D DEFICIENCY, FRACTURES): VITD: 15.29 ng/mL — ABNORMAL LOW (ref 30.00–100.00)

## 2014-09-30 LAB — HEMOGLOBIN A1C: Hgb A1c MFr Bld: 5.7 % (ref 4.6–6.5)

## 2014-09-30 MED ORDER — ALBUTEROL SULFATE HFA 108 (90 BASE) MCG/ACT IN AERS: 2.0000 | INHALATION_SPRAY | Freq: Four times a day (QID) | RESPIRATORY_TRACT | Status: DC | PRN

## 2014-09-30 MED ORDER — FLUTICASONE-SALMETEROL 250-50 MCG/DOSE IN AEPB: 1.0000 | INHALATION_SPRAY | Freq: Two times a day (BID) | RESPIRATORY_TRACT | Status: DC

## 2014-09-30 NOTE — Patient Instructions (Signed)

## 2014-09-30 NOTE — Progress Notes (Signed)
Subjective:    Patient ID: Hannah Noble, female    DOB: September 25, 1971, 44 y.o.   MRN: [id number]  HPI 44YO female presents for annual exam.  Generally feeling well. Has been using Proair daily for shortness of breath and would like to start back on Advair which helped her in the past during the winter months. No nighttime cough. No fever, chills. Quit smoking.  She is concerned about some recent weight gain. Notes some dietary indiscretion over the holidays. Plans to get back on track with healthy diet and exercise, by walking on treadmill.  Wt Readings from Last 3 Encounters:  09/30/14 244 lb (110.678 kg)  07/15/13 236 lb (107.049 kg)  03/25/13 246 lb (111.585 kg)     Past medical, surgical, family and social history per today's encounter.  Review of Systems  Constitutional: Negative for fever, chills, appetite change, fatigue and unexpected weight change.  Eyes: Negative for visual disturbance.  Respiratory: Negative for cough, chest tightness, shortness of breath and wheezing.   Cardiovascular: Negative for chest pain and leg swelling.  Gastrointestinal: Negative for nausea, vomiting, abdominal pain, diarrhea, constipation and blood in stool.  Musculoskeletal: Negative for myalgias and arthralgias.  Skin: Negative for color change and rash.  Hematological: Negative for adenopathy. Does not bruise/bleed easily.  Psychiatric/Behavioral: Negative for sleep disturbance and dysphoric mood. The patient is not nervous/anxious.        Objective:    BP 118/78 mmHg  Pulse 70  Temp(Src) 98.1 F (36.7 C) (Oral)  Ht 5\' 9"  (1.753 m)  Wt 244 lb (110.678 kg)  BMI 36.02 kg/m2  SpO2 97%  LMP 09/24/2014 Physical Exam  Constitutional: She is oriented to person, place, and time. She appears well-developed and well-nourished. No distress.  HENT:  Head: Normocephalic and atraumatic.  Right Ear: External ear normal.  Left Ear: External ear normal.  Nose: Nose normal.  Mouth/Throat:  Oropharynx is clear and moist. No oropharyngeal exudate.  Eyes: Conjunctivae are normal. Pupils are equal, round, and reactive to light. Right eye exhibits no discharge. Left eye exhibits no discharge. No scleral icterus.  Neck: Normal range of motion. Neck supple. No tracheal deviation present. No thyromegaly present.  Cardiovascular: Normal rate, regular rhythm, normal heart sounds and intact distal pulses.  Exam reveals no gallop and no friction rub.   No murmur heard. Pulmonary/Chest: Effort normal and breath sounds normal. No accessory muscle usage. No tachypnea. No respiratory distress. She has no decreased breath sounds. She has no wheezes. She has no rales. She exhibits no tenderness. Right breast exhibits no inverted nipple, no mass, no nipple discharge, no skin change and no tenderness. Left breast exhibits no inverted nipple, no mass, no nipple discharge, no skin change and no tenderness. Breasts are symmetrical.  Note that breasts are nodular bilaterally. No discrete masses appreciated.  Abdominal: Soft. Bowel sounds are normal. She exhibits no distension and no mass. There is no tenderness. There is no rebound and no guarding.  Musculoskeletal: Normal range of motion. She exhibits no edema or tenderness.  Lymphadenopathy:    She has no cervical adenopathy.  Neurological: She is alert and oriented to person, place, and time. No cranial nerve deficit. She exhibits normal muscle tone. Coordination normal.  Skin: Skin is warm and dry. No rash noted. She is not diaphoretic. No erythema. No pallor.  Psychiatric: She has a normal mood and affect. Her behavior is normal. Judgment and thought content normal.  Assessment & Plan:   Problem List Items Addressed This Visit      Unprioritized   Asthma in adult    Currently having daily symptoms of cough and dyspnea, which is typical for her during winter months. Exam is normal. Will add back Advair and continue prn Proair.     Relevant Medications      albuterol (PROAIR HFA) 108 (90 BASE) MCG/ACT inhaler      FLUTICASONE-SALMETEROL INHALER 250-50 MCG/PUFF   Obesity (BMI 30-39.9)    Wt Readings from Last 3 Encounters:  09/30/14 244 lb (110.678 kg)  07/15/13 236 lb (107.049 kg)  03/25/13 246 lb (111.585 kg)   Body mass index is 36.02 kg/(m^2). The patient is asked to make an attempt to improve diet and exercise patterns to aid in medical management of this problem.     Routine general medical examination at a health care facility - Primary    General medical exam normal today including breast exam. PAP and pelvic deferred as PAP normal in 2014 and 2011, both HPV neg. Will plan repeat PAP in 2017. Mammogram ordered. Encouraged healthy diet and exercise. Flu vaccine declined. Labs today including CBC, CMP, lipids, A1c, TSH.    Relevant Orders      CBC with Differential      Comprehensive metabolic panel      Lipid panel      Microalbumin / creatinine urine ratio      Vit D  25 hydroxy (rtn osteoporosis monitoring)      TSH      Hemoglobin A1c   Screening for breast cancer    Mammogram ordered.    Relevant Orders      MM Digital Diagnostic Bilat       Return in about 6 months (around 03/31/2015) for Recheck.

## 2014-09-30 NOTE — Assessment & Plan Note (Signed)
Mammogram ordered

## 2014-09-30 NOTE — Progress Notes (Signed)
Pre visit review using our clinic review tool, if applicable. No additional management support is needed unless otherwise documented below in the visit note. 

## 2014-09-30 NOTE — Assessment & Plan Note (Signed)
Currently having daily symptoms of cough and dyspnea, which is typical for her during winter months. Exam is normal. Will add back Advair and continue prn Proair.

## 2014-09-30 NOTE — Assessment & Plan Note (Signed)
Wt Readings from Last 3 Encounters:  09/30/14 244 lb (110.678 kg)  07/15/13 236 lb (107.049 kg)  03/25/13 246 lb (111.585 kg)   Body mass index is 36.02 kg/(m^2). The patient is asked to make an attempt to improve diet and exercise patterns to aid in medical management of this problem.

## 2014-09-30 NOTE — Assessment & Plan Note (Signed)
General medical exam normal today including breast exam. PAP and pelvic deferred as PAP normal in 2014 and 2011, both HPV neg. Will plan repeat PAP in 2017. Mammogram ordered. Encouraged healthy diet and exercise. Flu vaccine declined. Labs today including CBC, CMP, lipids, A1c, TSH.

## 2014-10-08 ENCOUNTER — Other Ambulatory Visit: Payer: Self-pay | Admitting: Internal Medicine

## 2014-10-08 DIAGNOSIS — N6310 Unspecified lump in the right breast, unspecified quadrant: Secondary | ICD-10-CM

## 2014-10-12 ENCOUNTER — Ambulatory Visit: Payer: Self-pay | Admitting: Internal Medicine

## 2014-10-28 ENCOUNTER — Encounter: Payer: Self-pay | Admitting: Internal Medicine

## 2014-12-03 ENCOUNTER — Telehealth: Payer: Self-pay | Admitting: *Deleted

## 2014-12-03 MED ORDER — FLUTICASONE-SALMETEROL 250-50 MCG/DOSE IN AEPB: 1.0000 | INHALATION_SPRAY | Freq: Two times a day (BID) | RESPIRATORY_TRACT | Status: DC

## 2014-12-03 NOTE — Telephone Encounter (Signed)
Pt called back, [facility] [address] Rx sent to pharmacy by escript

## 2014-12-03 NOTE — Addendum Note (Signed)
Addended by: Wynonia Lawman E on: 12/03/2014 01:41 PM   Modules accepted: Orders

## 2014-12-03 NOTE — Telephone Encounter (Signed)
Pt left VM, [facility] needed to be a 3 month supply. [facility], pharmacist said he told pt that she needed to call her insurance and that he gave her a number, because it is an insurance problem. States the Rx we sent was correctly sent. Called pt, advised her what she said. She said she had called the insurance and was told she needed a 75 day supply sent? She is going to re-call her insurance but would like a 60 day supply sent to pharmacy. Resent Rx.

## 2014-12-03 NOTE — Telephone Encounter (Signed)
Pt left VM, states insurance has changed, needing 3 month refill of Advair sent to pharmacy, did not specify which pharmacy. Called back, left message, requesting pharmacy information

## 2014-12-04 MED ORDER — FLUTICASONE-SALMETEROL 250-50 MCG/DOSE IN AEPB: 1.0000 | INHALATION_SPRAY | Freq: Two times a day (BID) | RESPIRATORY_TRACT | Status: DC

## 2014-12-04 NOTE — Addendum Note (Signed)
Addended by: Wynonia Lawman E on: 12/04/2014 07:57 AM   Modules accepted: Orders

## 2014-12-04 NOTE — Telephone Encounter (Addendum)
Pt called back, saying [facility] told her it needed to be sent as 30 each, even though Rx written for bid, advised her that that did not make sense since using 2 puffs daily, states that's what she was told and would like Rx sent. Rx sent to pharmacy by escript for 3 month supply

## 2015-01-06 ENCOUNTER — Other Ambulatory Visit: Payer: Self-pay | Admitting: Internal Medicine

## 2015-06-10 ENCOUNTER — Other Ambulatory Visit: Payer: Self-pay | Admitting: Internal Medicine

## 2015-06-21 ENCOUNTER — Other Ambulatory Visit: Payer: Self-pay | Admitting: Internal Medicine

## 2015-10-06 ENCOUNTER — Encounter: Payer: BLUE CROSS/BLUE SHIELD | Admitting: Internal Medicine

## 2015-10-26 ENCOUNTER — Other Ambulatory Visit: Payer: Self-pay | Admitting: Internal Medicine

## 2015-11-17 ENCOUNTER — Encounter: Payer: Self-pay | Admitting: Internal Medicine

## 2015-11-17 ENCOUNTER — Ambulatory Visit (INDEPENDENT_AMBULATORY_CARE_PROVIDER_SITE_OTHER): Payer: BLUE CROSS/BLUE SHIELD | Admitting: Internal Medicine

## 2015-11-17 VITALS — BP 111/73 | HR 98 | Temp 97.9°F | Ht 68.0 in | Wt 246.4 lb

## 2015-11-17 DIAGNOSIS — Z0000 Encounter for general adult medical examination without abnormal findings: Secondary | ICD-10-CM | POA: Diagnosis not present

## 2015-11-17 NOTE — Progress Notes (Signed)
Pre visit review using our clinic review tool, if applicable. No additional management support is needed unless otherwise documented below in the visit note. 

## 2015-11-17 NOTE — Assessment & Plan Note (Signed)
General medical exam including breast exam normal today. PAP and pelvic deferred as last PAP in 2014 normal, HPV neg, plan repeat in 2018. Labs today. Declines flu vaccine. Encouraged healthy diet and exercise.

## 2015-11-17 NOTE — Progress Notes (Signed)
Subjective:    Patient ID: Hannah Noble, female    DOB: Jul 05, 1971, 45 y.o.   MRN: [id number]  HPI  45YO female presents for physical exam.  Feeling well. Notes some dietary indiscretion and lack of exercise. Planning to make an effort to get back on track with healthy diet and exercise.  Has heavy menses first 2 days of cycle. Uses pad >every 2 hr.    Wt Readings from Last 3 Encounters:  11/17/15 246 lb 6 oz (111.755 kg)  09/30/14 244 lb (110.678 kg)  07/15/13 236 lb (107.049 kg)   BP Readings from Last 3 Encounters:  11/17/15 111/73  09/30/14 118/78  07/15/13 118/78    Past Medical History  Diagnosis Date  . Asthma    Family History  Problem Relation Age of Onset  . COPD Father   . Cancer Maternal Grandfather     skin  . Cancer Paternal Grandmother     lung   Past Surgical History  Procedure Laterality Date  . Essure tubal ligation  2001-2002    Dr Rayford Halsted  . Vaginal delivery      X2   Social History   Social History  . Marital Status: Single    Spouse Name: N/A  . Number of Children: 2  . Years of Education: N/A   Occupational History  .  [employer]   Social History Main Topics  . Smoking status: Former Smoker -- 0.50 packs/day    Types: Cigarettes    Quit date: 09/25/2012  . Smokeless tobacco: None  . Alcohol Use: Yes     Comment: Rarely  . Drug Use: No  . Sexual Activity: Not Asked   Other Topics Concern  . None   Social History Narrative   Lives in [place] with children 14YO and [place]. Works at [employer] [place].      Regular Exercise -  Yes, walk 1 to 3 times a week 30 min   Daily Caffeine Use:  Diet pepsi 1-2             Review of Systems  Constitutional: Negative for fever, chills, appetite change, fatigue and unexpected weight change.  Eyes: Negative for visual disturbance.  Respiratory: Negative for shortness of breath.   Cardiovascular: Negative for chest pain and leg swelling.  Gastrointestinal: Negative for  nausea, vomiting, abdominal pain, diarrhea, constipation and blood in stool.  Genitourinary: Negative for dysuria.  Musculoskeletal: Negative for myalgias and arthralgias.  Skin: Negative for color change and rash.  Neurological: Negative for weakness.  Hematological: Negative for adenopathy. Does not bruise/bleed easily.  Psychiatric/Behavioral: Negative for sleep disturbance, dysphoric mood and agitation. The patient is not nervous/anxious.        Objective:    BP 111/73 mmHg  Pulse 98  Temp(Src) 97.9 F (36.6 C) (Oral)  Ht 5\' 8"  (1.727 m)  Wt 246 lb 6 oz (111.755 kg)  BMI 37.47 kg/m2  SpO2 98%  LMP 11/05/2015 Physical Exam  Constitutional: She is oriented to person, place, and time. She appears well-developed and well-nourished. No distress.  HENT:  Head: Normocephalic and atraumatic.  Right Ear: External ear normal.  Left Ear: External ear normal.  Nose: Nose normal.  Mouth/Throat: Oropharynx is clear and moist. No oropharyngeal exudate.  Eyes: Conjunctivae are normal. Pupils are equal, round, and reactive to light. Right eye exhibits no discharge. Left eye exhibits no discharge. No scleral icterus.  Neck: Normal range of motion. Neck supple. No tracheal deviation present. No  thyromegaly present.  Cardiovascular: Normal rate, regular rhythm, normal heart sounds and intact distal pulses.  Exam reveals no gallop and no friction rub.   No murmur heard. Pulmonary/Chest: Effort normal and breath sounds normal. No accessory muscle usage. No tachypnea. No respiratory distress. She has no decreased breath sounds. She has no wheezes. She has no rales. She exhibits no tenderness. Right breast exhibits no inverted nipple, no mass, no nipple discharge, no skin change and no tenderness. Left breast exhibits no inverted nipple, no mass, no nipple discharge, no skin change and no tenderness. Breasts are symmetrical.  Abdominal: Soft. Bowel sounds are normal. She exhibits no distension and no  mass. There is no tenderness. There is no rebound and no guarding.  Musculoskeletal: Normal range of motion. She exhibits no edema or tenderness.  Lymphadenopathy:    She has no cervical adenopathy.  Neurological: She is alert and oriented to person, place, and time. No cranial nerve deficit. She exhibits normal muscle tone. Coordination normal.  Skin: Skin is warm and dry. No rash noted. She is not diaphoretic. No erythema. No pallor.  Psychiatric: She has a normal mood and affect. Her behavior is normal. Judgment and thought content normal.          Assessment & Plan:   Problem List Items Addressed This Visit      Unprioritized   Routine general medical examination at a health care facility - Primary    General medical exam including breast exam normal today. PAP and pelvic deferred as last PAP in 2014 normal, HPV neg, plan repeat in 2018. Labs today. Declines flu vaccine. Encouraged healthy diet and exercise.      Relevant Orders   TSH   CBC with Differential/Platelet   Comprehensive metabolic panel   Lipid panel   VITAMIN D 25 Hydroxy (Vit-D Deficiency, Fractures)   MM Digital Screening       Return in about 1 year (around 11/16/2016) for Physical.  Ronette Deter, MD Internal Medicine [facility]

## 2015-11-17 NOTE — Patient Instructions (Signed)
Health Maintenance, Female Adopting a healthy lifestyle and getting preventive care can go a long way to promote health and wellness. Talk with your health care provider about what schedule of regular examinations is right for you. This is a good chance for you to check in with your provider about disease prevention and staying healthy. In between checkups, there are plenty of things you can do on your own. Experts have done a lot of research about which lifestyle changes and preventive measures are most likely to keep you healthy. Ask your health care provider for more information. WEIGHT AND DIET  Eat a healthy diet  Be sure to include plenty of vegetables, fruits, low-fat dairy products, and lean protein.  Do not eat a lot of foods high in solid fats, added sugars, or salt.  Get regular exercise. This is one of the most important things you can do for your health.  Most adults should exercise for at least 150 minutes each week. The exercise should increase your heart rate and make you sweat (moderate-intensity exercise).  Most adults should also do strengthening exercises at least twice a week. This is in addition to the moderate-intensity exercise.  Maintain a healthy weight  Body mass index (BMI) is a measurement that can be used to identify possible weight problems. It estimates body fat based on height and weight. Your health care provider can help determine your BMI and help you achieve or maintain a healthy weight.  For females 20 years of age and older:   A BMI below 18.5 is considered underweight.  A BMI of 18.5 to 24.9 is normal.  A BMI of 25 to 29.9 is considered overweight.  A BMI of 30 and above is considered obese.  Watch levels of cholesterol and blood lipids  You should start having your blood tested for lipids and cholesterol at 43 years of age, then have this test every 5 years.  You may need to have your cholesterol levels checked more often if:  Your lipid  or cholesterol levels are high.  You are older than 43 years of age.  You are at high risk for heart disease.  CANCER SCREENING   Lung Cancer  Lung cancer screening is recommended for adults 55-80 years old who are at high risk for lung cancer because of a history of smoking.  A yearly low-dose CT scan of the lungs is recommended for people who:  Currently smoke.  Have quit within the past 15 years.  Have at least a 30-pack-year history of smoking. A pack year is smoking an average of one pack of cigarettes a day for 1 year.  Yearly screening should continue until it has been 15 years since you quit.  Yearly screening should stop if you develop a health problem that would prevent you from having lung cancer treatment.  Breast Cancer  Practice breast self-awareness. This means understanding how your breasts normally appear and feel.  It also means doing regular breast self-exams. Let your health care provider know about any changes, no matter how small.  If you are in your 20s or 30s, you should have a clinical breast exam (CBE) by a health care provider every 1-3 years as part of a regular health exam.  If you are 40 or older, have a CBE every year. Also consider having a breast X-ray (mammogram) every year.  If you have a family history of breast cancer, talk to your health care provider about genetic screening.  If you   are at high risk for breast cancer, talk to your health care provider about having an MRI and a mammogram every year.  Breast cancer gene (BRCA) assessment is recommended for women who have family members with BRCA-related cancers. BRCA-related cancers include:  Breast.  Ovarian.  Tubal.  Peritoneal cancers.  Results of the assessment will determine the need for genetic counseling and BRCA1 and BRCA2 testing. Cervical Cancer Your health care provider may recommend that you be screened regularly for cancer of the pelvic organs (ovaries, uterus, and  vagina). This screening involves a pelvic examination, including checking for microscopic changes to the surface of your cervix (Pap test). You may be encouraged to have this screening done every 3 years, beginning at age 21.  For women ages 30-65, health care providers may recommend pelvic exams and Pap testing every 3 years, or they may recommend the Pap and pelvic exam, combined with testing for human papilloma virus (HPV), every 5 years. Some types of HPV increase your risk of cervical cancer. Testing for HPV may also be done on women of any age with unclear Pap test results.  Other health care providers may not recommend any screening for nonpregnant women who are considered low risk for pelvic cancer and who do not have symptoms. Ask your health care provider if a screening pelvic exam is right for you.  If you have had past treatment for cervical cancer or a condition that could lead to cancer, you need Pap tests and screening for cancer for at least 20 years after your treatment. If Pap tests have been discontinued, your risk factors (such as having a new sexual partner) need to be reassessed to determine if screening should resume. Some women have medical problems that increase the chance of getting cervical cancer. In these cases, your health care provider may recommend more frequent screening and Pap tests. Colorectal Cancer  This type of cancer can be detected and often prevented.  Routine colorectal cancer screening usually begins at 43 years of age and continues through 43 years of age.  Your health care provider may recommend screening at an earlier age if you have risk factors for colon cancer.  Your health care provider may also recommend using home test kits to check for hidden blood in the stool.  A small camera at the end of a tube can be used to examine your colon directly (sigmoidoscopy or colonoscopy). This is done to check for the earliest forms of colorectal  cancer.  Routine screening usually begins at age 50.  Direct examination of the colon should be repeated every 5-10 years through 43 years of age. However, you may need to be screened more often if early forms of precancerous polyps or small growths are found. Skin Cancer  Check your skin from head to toe regularly.  Tell your health care provider about any new moles or changes in moles, especially if there is a change in a mole's shape or color.  Also tell your health care provider if you have a mole that is larger than the size of a pencil eraser.  Always use sunscreen. Apply sunscreen liberally and repeatedly throughout the day.  Protect yourself by wearing long sleeves, pants, a wide-brimmed hat, and sunglasses whenever you are outside. HEART DISEASE, DIABETES, AND HIGH BLOOD PRESSURE   High blood pressure causes heart disease and increases the risk of stroke. High blood pressure is more likely to develop in:  People who have blood pressure in the high end   of the normal range (130-139/85-89 mm Hg).  People who are overweight or obese.  People who are African American.  If you are 38-23 years of age, have your blood pressure checked every 3-5 years. If you are 61 years of age or older, have your blood pressure checked every year. You should have your blood pressure measured twice--once when you are at a hospital or clinic, and once when you are not at a hospital or clinic. Record the average of the two measurements. To check your blood pressure when you are not at a hospital or clinic, you can use:  An automated blood pressure machine at a pharmacy.  A home blood pressure monitor.  If you are between 45 years and 39 years old, ask your health care provider if you should take aspirin to prevent strokes.  Have regular diabetes screenings. This involves taking a blood sample to check your fasting blood sugar level.  If you are at a normal weight and have a low risk for diabetes,  have this test once every three years after 43 years of age.  If you are overweight and have a high risk for diabetes, consider being tested at a younger age or more often. PREVENTING INFECTION  Hepatitis B  If you have a higher risk for hepatitis B, you should be screened for this virus. You are considered at high risk for hepatitis B if:  You were born in a country where hepatitis B is common. Ask your health care provider which countries are considered high risk.  Your parents were born in a high-risk country, and you have not been immunized against hepatitis B (hepatitis B vaccine).  You have HIV or AIDS.  You use needles to inject street drugs.  You live with someone who has hepatitis B.  You have had sex with someone who has hepatitis B.  You get hemodialysis treatment.  You take certain medicines for conditions, including cancer, organ transplantation, and autoimmune conditions. Hepatitis C  Blood testing is recommended for:  Everyone born from [birth year] through 1963.  Anyone with known risk factors for hepatitis C. Sexually transmitted infections (STIs)  You should be screened for sexually transmitted infections (STIs) including gonorrhea and chlamydia if:  You are sexually active and are younger than 43 years of age.  You are older than 43 years of age and your health care provider tells you that you are at risk for this type of infection.  Your sexual activity has changed since you were last screened and you are at an increased risk for chlamydia or gonorrhea. Ask your health care provider if you are at risk.  If you do not have HIV, but are at risk, it may be recommended that you take a prescription medicine daily to prevent HIV infection. This is called pre-exposure prophylaxis (PrEP). You are considered at risk if:  You are sexually active and do not regularly use condoms or know the HIV status of your partner(s).  You take drugs by injection.  You are sexually  active with a partner who has HIV. Talk with your health care provider about whether you are at high risk of being infected with HIV. If you choose to begin PrEP, you should first be tested for HIV. You should then be tested every 3 months for as long as you are taking PrEP.  PREGNANCY   If you are premenopausal and you may become pregnant, ask your health care provider about preconception counseling.  If you may  become pregnant, take 400 to 800 micrograms (mcg) of folic acid every day.  If you want to prevent pregnancy, talk to your health care provider about birth control (contraception). OSTEOPOROSIS AND MENOPAUSE   Osteoporosis is a disease in which the bones lose minerals and strength with aging. This can result in serious bone fractures. Your risk for osteoporosis can be identified using a bone density scan.  If you are 61 years of age or older, or if you are at risk for osteoporosis and fractures, ask your health care provider if you should be screened.  Ask your health care provider whether you should take a calcium or vitamin D supplement to lower your risk for osteoporosis.  Menopause may have certain physical symptoms and risks.  Hormone replacement therapy may reduce some of these symptoms and risks. Talk to your health care provider about whether hormone replacement therapy is right for you.  HOME CARE INSTRUCTIONS   Schedule regular health, dental, and eye exams.  Stay current with your immunizations.   Do not use any tobacco products including cigarettes, chewing tobacco, or electronic cigarettes.  If you are pregnant, do not drink alcohol.  If you are breastfeeding, limit how much and how often you drink alcohol.  Limit alcohol intake to no more than 1 drink per day for nonpregnant women. One drink equals 12 ounces of beer, 5 ounces of wine, or 1 ounces of hard liquor.  Do not use street drugs.  Do not share needles.  Ask your health care provider for help if  you need support or information about quitting drugs.  Tell your health care provider if you often feel depressed.  Tell your health care provider if you have ever been abused or do not feel safe at home.   This information is not intended to replace advice given to you by your health care provider. Make sure you discuss any questions you have with your health care provider.   Document Released: 03/27/2011 Document Revised: 10/02/2014 Document Reviewed: 08/13/2013 Elsevier Interactive Patient Education [employer].

## 2015-11-18 LAB — LIPID PANEL
Cholesterol: 185 mg/dL (ref 0–200)
HDL: 69.2 mg/dL (ref >39.00)
LDL Cholesterol: 100 mg/dL — ABNORMAL HIGH (ref 0–99)
NonHDL: 115.38
Total CHOL/HDL Ratio: 3
Triglycerides: 76 mg/dL (ref 0.0–149.0)
VLDL: 15.2 mg/dL (ref 0.0–40.0)

## 2015-11-18 LAB — CBC WITH DIFFERENTIAL/PLATELET
Basophils Absolute: 0 10*3/uL (ref 0.0–0.1)
Basophils Relative: 0.5 % (ref 0.0–3.0)
Eosinophils Absolute: 0.3 10*3/uL (ref 0.0–0.7)
Eosinophils Relative: 3.5 % (ref 0.0–5.0)
HCT: 36.4 % (ref 36.0–46.0)
Hemoglobin: 12.4 g/dL (ref 12.0–15.0)
Lymphocytes Relative: 23.8 % (ref 12.0–46.0)
Lymphs Abs: 2.2 10*3/uL (ref 0.7–4.0)
MCHC: 33.9 g/dL (ref 30.0–36.0)
MCV: 87.5 fl (ref 78.0–100.0)
Monocytes Absolute: 0.4 10*3/uL (ref 0.1–1.0)
Monocytes Relative: 4.8 % (ref 3.0–12.0)
Neutro Abs: 6.1 10*3/uL (ref 1.4–7.7)
Neutrophils Relative %: 67.4 % (ref 43.0–77.0)
Platelets: 321 10*3/uL (ref 150.0–400.0)
RBC: 4.16 Mil/uL (ref 3.87–5.11)
RDW: 13.9 % (ref 11.5–15.5)
WBC: 9 10*3/uL (ref 4.0–10.5)

## 2015-11-18 LAB — COMPREHENSIVE METABOLIC PANEL
ALT: 14 U/L (ref 0–35)
AST: 13 U/L (ref 0–37)
Albumin: 4.3 g/dL (ref 3.5–5.2)
Alkaline Phosphatase: 42 U/L (ref 39–117)
BUN: 11 mg/dL (ref 6–23)
CO2: 25 mEq/L (ref 19–32)
Calcium: 9.1 mg/dL (ref 8.4–10.5)
Chloride: 103 mEq/L (ref 96–112)
Creatinine, Ser: 0.71 mg/dL (ref 0.40–1.20)
GFR: 95.74 mL/min (ref >60.00)
Glucose, Bld: 89 mg/dL (ref 70–99)
Potassium: 4 mEq/L (ref 3.5–5.1)
Sodium: 137 mEq/L (ref 135–145)
Total Bilirubin: 0.3 mg/dL (ref 0.2–1.2)
Total Protein: 7.2 g/dL (ref 6.0–8.3)

## 2015-11-18 LAB — TSH: TSH: 1.71 u[IU]/mL (ref 0.35–4.50)

## 2015-11-18 LAB — VITAMIN D 25 HYDROXY (VIT D DEFICIENCY, FRACTURES): VITD: 14.48 ng/mL — ABNORMAL LOW (ref 30.00–100.00)

## 2015-11-22 ENCOUNTER — Telehealth: Payer: Self-pay | Admitting: *Deleted

## 2015-11-22 NOTE — Telephone Encounter (Signed)
Form faxed, and mailed original to pt

## 2015-11-22 NOTE — Telephone Encounter (Signed)
Hannah Free do you have her forms, Please advise, thanks

## 2015-11-22 NOTE — Telephone Encounter (Signed)
Patient was checking the status on her health screen form that she had filled out by Dr. Gilford Rile. The deadline to turn the form in will be 11/23/15.  Pt. [phone].

## 2015-11-24 ENCOUNTER — Ambulatory Visit
Admission: RE | Admit: 2015-11-24 | Discharge: 2015-11-24 | Disposition: A | Discharge status: Home / Self Care | Payer: BLUE CROSS/BLUE SHIELD | Source: Ambulatory Visit | Attending: Internal Medicine | Admitting: Internal Medicine

## 2015-11-24 DIAGNOSIS — Z0000 Encounter for general adult medical examination without abnormal findings: Secondary | ICD-10-CM

## 2015-11-24 DIAGNOSIS — Z1231 Encounter for screening mammogram for malignant neoplasm of breast: Secondary | ICD-10-CM | POA: Insufficient documentation

## 2016-02-11 ENCOUNTER — Other Ambulatory Visit: Payer: Self-pay | Admitting: Internal Medicine

## 2016-04-09 ENCOUNTER — Other Ambulatory Visit: Payer: Self-pay | Admitting: Internal Medicine

## 2016-08-22 ENCOUNTER — Other Ambulatory Visit: Payer: Self-pay | Admitting: Internal Medicine

## 2016-08-22 MED ORDER — ALBUTEROL SULFATE HFA 108 (90 BASE) MCG/ACT IN AERS: INHALATION_SPRAY | RESPIRATORY_TRACT | 1 refills | Status: DC

## 2016-08-22 NOTE — Telephone Encounter (Signed)
Seen by Dr Gilford Rile for CPE follow up 1 year  Follow up 11/13/16 with Joycelyn Schmid FNP Please advise

## 2016-08-22 NOTE — Telephone Encounter (Signed)
Pt called and is requesting a refill on her PROAIR HFA 108 (90 BASE) MCG/ACT inhaler. Please advise, thank you!  [id number] [place], [place]

## 2016-08-29 ENCOUNTER — Telehealth: Payer: Self-pay | Admitting: Internal Medicine

## 2016-08-29 NOTE — Telephone Encounter (Signed)
Okay to refill? 

## 2016-08-29 NOTE — Telephone Encounter (Signed)
Ok to do so

## 2016-08-29 NOTE — Telephone Encounter (Signed)
Pt called and is requesting a refill on albuterol (PROAIR HFA) 108 (90 Base) MCG/ACT inhaler. She is scheduled with M. Arnett in February, pt would like at least one to hold her over till her appt. Could you please call pt if we fill it or not. Please advise, thank you!  [id number] [place], [place]  Call pt @ [phone]

## 2016-08-30 MED ORDER — ALBUTEROL SULFATE HFA 108 (90 BASE) MCG/ACT IN AERS: INHALATION_SPRAY | RESPIRATORY_TRACT | 1 refills | Status: DC

## 2016-08-30 NOTE — Telephone Encounter (Signed)
Medication has been refilled.

## 2016-10-09 ENCOUNTER — Encounter: Payer: BLUE CROSS/BLUE SHIELD | Admitting: Internal Medicine

## 2016-10-12 ENCOUNTER — Other Ambulatory Visit: Payer: Self-pay | Admitting: Family

## 2016-10-15 DIAGNOSIS — J45998 Other asthma: Secondary | ICD-10-CM | POA: Diagnosis not present

## 2016-11-03 ENCOUNTER — Other Ambulatory Visit: Payer: Self-pay | Admitting: Family

## 2016-11-03 NOTE — Telephone Encounter (Signed)
Pt called needing a refill for albuterol (PROAIR HFA) 108 (90 Base) MCG/ACT inhaler.   [id number] [place], [place]  Call pt @ [phone]. Thank you!

## 2016-11-03 NOTE — Telephone Encounter (Signed)
Nov 11/13/16 Last office visit 11/17/15 Dr Gilford Rile

## 2016-11-06 MED ORDER — ALBUTEROL SULFATE HFA 108 (90 BASE) MCG/ACT IN AERS: INHALATION_SPRAY | RESPIRATORY_TRACT | 1 refills | Status: DC

## 2016-11-10 ENCOUNTER — Encounter: Payer: Self-pay | Admitting: Family

## 2016-11-10 NOTE — Progress Notes (Signed)
Subjective:    Patient ID: Hannah Noble, female    DOB: 01-Jan-1971, 46 y.o.   MRN: [id number]  CC: Hannah Noble is a 46 y.o. female who presents today for physical exam.    HPI: Feeling well.    Asthma- using Advair once every day; prn albuterol. Weather triggers. No noctural cough, wheezing.   Notes recurrent boils under armpits from shaving. No MRSA.      Colorectal Cancer Screening: No early family history Breast Cancer Screening: Mammogram due in 2 months Cervical Cancer Screening: due; will come back for pap. Bone Health screening/DEXA for 65+: No increased fracture risk. Defer screening at this time. Lung Cancer Screening: Doesn't have 30 year pack year history and age > 7 years.       Tetanus - due        Pneumococcal - Candidate for. Declined  Labs: Screening labs today. Exercise: Gets regular exercise.  Alcohol use: Rarely Smoking/tobacco use: former smoker.  Regular dental exams: In need of dental exam. Wears seat belt: Yes. Skin: no new lesions.   HISTORY:  Past Medical History:  Diagnosis Date  . Asthma     Past Surgical History:  Procedure Laterality Date  . BREAST BIOPSY Right    needle, with Sankar  . ESSURE TUBAL LIGATION  2012   Dr Rayford Halsted  . VAGINAL DELIVERY     X2   Family History  Problem Relation Age of Onset  . COPD Father   . Cancer Maternal Grandfather     melanoma  . Cancer Paternal Grandmother     lung  . Breast cancer Paternal Grandmother 57      ALLERGIES: Patient has no known allergies.  Current Outpatient Prescriptions on File Prior to Visit  Medication Sig Dispense Refill  . albuterol (PROAIR HFA) 108 (90 Base) MCG/ACT inhaler INHALE 2 PUFFS BY MOUTH EVERY 6 HOURS AS NEEDED FOR WHEEZING. 8.5 Inhaler 1  . cetirizine (ZYRTEC) 10 MG tablet Take 10 mg by mouth daily.     No current facility-administered medications on file prior to visit.     Social History  Substance Use Topics  . Smoking status: Former  Smoker    Packs/day: 0.50    Types: Cigarettes    Quit date: 09/25/2012  . Smokeless tobacco: Not on file  . Alcohol use Yes     Comment: Rarely    Review of Systems  Constitutional: Negative for chills, fever and unexpected weight change.  HENT: Negative for congestion.   Respiratory: Negative for cough.   Cardiovascular: Negative for chest pain, palpitations and leg swelling.  Gastrointestinal: Negative for abdominal distention, abdominal pain, nausea and vomiting.  Genitourinary: Negative for dyspareunia, dysuria and vaginal discharge.  Musculoskeletal: Negative for arthralgias and myalgias.  Skin: Positive for rash.  Neurological: Negative for headaches.  Hematological: Negative for adenopathy.  Psychiatric/Behavioral: Negative for confusion.      Objective:    BP 128/72   Pulse 83   Temp 97.8 F (36.6 C) (Oral)   Ht 5\' 8"  (1.727 m)   Wt 257 lb 12.8 oz (116.9 kg)   LMP 10/20/2016 (Exact Date)   SpO2 99%   BMI 39.20 kg/m   BP Readings from Last 3 Encounters:  11/13/16 128/72  11/17/15 111/73  09/30/14 118/78   Wt Readings from Last 3 Encounters:  11/13/16 257 lb 12.8 oz (116.9 kg)  11/17/15 246 lb 6 oz (111.8 kg)  09/30/14 244 lb (110.7 kg)    Physical Exam  Constitutional: She appears well-developed and well-nourished.  Eyes: Conjunctivae are normal.  Neck: No thyroid mass and no thyromegaly present.  Cardiovascular: Normal rate, regular rhythm, normal heart sounds and normal pulses.   Pulmonary/Chest: Effort normal and breath sounds normal. She has no wheezes. She has no rhonchi. She has no rales. Right breast exhibits no inverted nipple, no mass, no nipple discharge, no skin change and no tenderness. Left breast exhibits no inverted nipple, no mass, no nipple discharge, no skin change and no tenderness. Breasts are symmetrical.  CBE performed.   Lymphadenopathy:       Head (right side): No submental, no submandibular, no tonsillar, no preauricular, no  posterior auricular and no occipital adenopathy present.       Head (left side): No submental, no submandibular, no tonsillar, no preauricular, no posterior auricular and no occipital adenopathy present.    She has no cervical adenopathy.       Right cervical: No superficial cervical, no deep cervical and no posterior cervical adenopathy present.      Left cervical: No superficial cervical, no deep cervical and no posterior cervical adenopathy present.    She has no axillary adenopathy.  Neurological: She is alert.  Skin: Skin is warm and dry. Rash noted. Rash is papular. Rash is not pustular.  <1cm Papules noted bilateral axilla. No drainage, streaking, surrounding erythema.  Psychiatric: She has a normal mood and affect. Her speech is normal and behavior is normal. Thought content normal.  Vitals reviewed.      Assessment & Plan:   Problem List Items Addressed This Visit      Respiratory   Asthma in adult    Stable. Will continue regimen.      Relevant Medications   Fluticasone-Salmeterol (ADVAIR DISKUS) 250-50 MCG/DOSE AEPB     Musculoskeletal and Integument   Folliculitis    Acute on chronic. Consistent with folliculitis.WIll treat with bactroban, warm compresses. If worsens, patient will let me know and will consider I & D. Referral to dermatology based on chronicity.         Other   Routine general medical examination at a health care facility - Primary    No early family h/o colon cancer history. Declines pneumovax. Fasting labs today. Advised tdap at local pharmacy. CBE done. Encouraged 3d mammogram. Declined pap today and will return for pap smear, no pelvic complaints today.       Relevant Medications   mupirocin cream (BACTROBAN) 2 %   Other Relevant Orders   MM DIGITAL SCREENING BILATERAL   CBC with Differential/Platelet   Comprehensive metabolic panel   Hemoglobin A1c   Lipid panel   TSH   VITAMIN D 25 Hydroxy (Vit-D Deficiency, Fractures)   Ambulatory  referral to Dermatology       I have changed Ms. Longest's ADVAIR DISKUS to Fluticasone-Salmeterol. I am also having her start on mupirocin cream. Additionally, I am having her maintain her cetirizine and albuterol.   Meds ordered this encounter  Medications  . Fluticasone-Salmeterol (ADVAIR DISKUS) 250-50 MCG/DOSE AEPB    Sig: INHALE 1 PUFF INTO THE LUNGS 1 ( ONCE) TIME DAILY.    Dispense:  180 each    Refill:  1    Order Specific Question:   Supervising Provider    Answer:   Deborra Medina L [[id number]]  . mupirocin cream (BACTROBAN) 2 %    Sig: Apply 1 application topically 3 (three) times daily.    Dispense:  15 g    Refill:  1    Order Specific Question:   Supervising Provider    Answer:   Crecencio Mc [[id number]]    Return precautions given.   Risks, benefits, and alternatives of the medications and treatment plan prescribed today were discussed, and patient expressed understanding.   Education regarding symptom management and diagnosis given to patient on AVS.   Continue to follow with Mable Paris, FNP for routine health maintenance.   Hannah Noble and I agreed with plan.   Mable Paris, FNP

## 2016-11-13 ENCOUNTER — Encounter: Payer: Self-pay | Admitting: Family

## 2016-11-13 ENCOUNTER — Ambulatory Visit (INDEPENDENT_AMBULATORY_CARE_PROVIDER_SITE_OTHER): Payer: BLUE CROSS/BLUE SHIELD | Admitting: Family

## 2016-11-13 ENCOUNTER — Telehealth: Payer: Self-pay | Admitting: Family

## 2016-11-13 VITALS — BP 128/72 | HR 83 | Temp 97.8°F | Ht 68.0 in | Wt 257.8 lb

## 2016-11-13 DIAGNOSIS — L739 Follicular disorder, unspecified: Secondary | ICD-10-CM | POA: Insufficient documentation

## 2016-11-13 DIAGNOSIS — J4520 Mild intermittent asthma, uncomplicated: Secondary | ICD-10-CM | POA: Diagnosis not present

## 2016-11-13 DIAGNOSIS — Z0000 Encounter for general adult medical examination without abnormal findings: Secondary | ICD-10-CM | POA: Diagnosis not present

## 2016-11-13 LAB — LIPID PANEL
Cholesterol: 166 mg/dL (ref 0–200)
HDL: 58.3 mg/dL (ref >39.00)
LDL Cholesterol: 92 mg/dL (ref 0–99)
NonHDL: 107.96
Total CHOL/HDL Ratio: 3
Triglycerides: 81 mg/dL (ref 0.0–149.0)
VLDL: 16.2 mg/dL (ref 0.0–40.0)

## 2016-11-13 LAB — COMPREHENSIVE METABOLIC PANEL
ALT: 13 U/L (ref 0–35)
AST: 11 U/L (ref 0–37)
Albumin: 4.2 g/dL (ref 3.5–5.2)
Alkaline Phosphatase: 36 U/L — ABNORMAL LOW (ref 39–117)
BUN: 9 mg/dL (ref 6–23)
CO2: 26 mEq/L (ref 19–32)
Calcium: 8.9 mg/dL (ref 8.4–10.5)
Chloride: 107 mEq/L (ref 96–112)
Creatinine, Ser: 0.67 mg/dL (ref 0.40–1.20)
GFR: 101.89 mL/min (ref >60.00)
Glucose, Bld: 91 mg/dL (ref 70–99)
Potassium: 4.6 mEq/L (ref 3.5–5.1)
Sodium: 138 mEq/L (ref 135–145)
Total Bilirubin: 0.4 mg/dL (ref 0.2–1.2)
Total Protein: 6.9 g/dL (ref 6.0–8.3)

## 2016-11-13 LAB — CBC WITH DIFFERENTIAL/PLATELET
Basophils Absolute: 0 10*3/uL (ref 0.0–0.1)
Basophils Relative: 0.6 % (ref 0.0–3.0)
Eosinophils Absolute: 0.3 10*3/uL (ref 0.0–0.7)
Eosinophils Relative: 3.4 % (ref 0.0–5.0)
HCT: 37.9 % (ref 36.0–46.0)
Hemoglobin: 12.6 g/dL (ref 12.0–15.0)
Lymphocytes Relative: 23 % (ref 12.0–46.0)
Lymphs Abs: 1.8 10*3/uL (ref 0.7–4.0)
MCHC: 33.3 g/dL (ref 30.0–36.0)
MCV: 88.8 fl (ref 78.0–100.0)
Monocytes Absolute: 0.5 10*3/uL (ref 0.1–1.0)
Monocytes Relative: 6.6 % (ref 3.0–12.0)
Neutro Abs: 5.1 10*3/uL (ref 1.4–7.7)
Neutrophils Relative %: 66.4 % (ref 43.0–77.0)
Platelets: 315 10*3/uL (ref 150.0–400.0)
RBC: 4.27 Mil/uL (ref 3.87–5.11)
RDW: 13.8 % (ref 11.5–15.5)
WBC: 7.7 10*3/uL (ref 4.0–10.5)

## 2016-11-13 LAB — VITAMIN D 25 HYDROXY (VIT D DEFICIENCY, FRACTURES): VITD: 8.99 ng/mL — ABNORMAL LOW (ref 30.00–100.00)

## 2016-11-13 LAB — HEMOGLOBIN A1C: Hgb A1c MFr Bld: 5.6 % (ref 4.6–6.5)

## 2016-11-13 LAB — TSH: TSH: 1.69 u[IU]/mL (ref 0.35–4.50)

## 2016-11-13 MED ORDER — FLUTICASONE-SALMETEROL 250-50 MCG/DOSE IN AEPB: INHALATION_SPRAY | RESPIRATORY_TRACT | 1 refills | Status: DC

## 2016-11-13 MED ORDER — MUPIROCIN CALCIUM 2 % EX CREA: 1.0000 "application " | TOPICAL_CREAM | Freq: Three times a day (TID) | CUTANEOUS | 1 refills | Status: DC

## 2016-11-13 NOTE — Telephone Encounter (Signed)
Pt dropped off physical form to be filled out and faxed back to [phone].Marland Kitchen Please advise.Marland Kitchen Place in Margaret's colored folder up front.

## 2016-11-13 NOTE — Patient Instructions (Addendum)
Please return for pap smear.   Advisre 3d mammogram.  Derm referral  Labs today  Tdap at local pharmacy  We placed a referral. Mammogram this year. I asked that you call one the below locations and schedule this when it is convenient for you.   If you have dense breasts, you may ask for 3D mammogram over the traditional 2D mammogram as new evidence suggest 3D is superior. Please note that NOT all insurance companies cover 3D and you may have to pay a higher copay. You may call your insurance company to further clarify your benefits.   Options for [place]  [place], [place]  * [facility] [place] [place] * Note if you ask for 3D mammogram at this location, you must request [place], [place] location*

## 2016-11-13 NOTE — Telephone Encounter (Signed)
Paperwork is in Chiropod[facility].

## 2016-11-13 NOTE — Assessment & Plan Note (Signed)
Stable. Will continue regimen 

## 2016-11-13 NOTE — Assessment & Plan Note (Signed)
No early family h/o colon cancer history. Declines pneumovax. Fasting labs today. Advised tdap at local pharmacy. CBE done. Encouraged 3d mammogram. Declined pap today and will return for pap smear, no pelvic complaints today.

## 2016-11-13 NOTE — Progress Notes (Signed)
Pre visit review using our clinic review tool, if applicable. No additional management support is needed unless otherwise documented below in the visit note. 

## 2016-11-13 NOTE — Assessment & Plan Note (Addendum)
Acute on chronic. Consistent with folliculitis.WIll treat with bactroban, warm compresses. If worsens, patient will let me know and will consider I & D. Referral to dermatology based on chronicity.

## 2016-11-20 ENCOUNTER — Encounter: Payer: Self-pay | Admitting: Family

## 2016-12-20 ENCOUNTER — Ambulatory Visit: Payer: BLUE CROSS/BLUE SHIELD

## 2016-12-20 ENCOUNTER — Other Ambulatory Visit: Payer: Self-pay | Admitting: Family

## 2016-12-25 NOTE — Telephone Encounter (Signed)
Pt called to follow up on medication.   [id number] [place], [place].  Call pt @ [phone]. Thank you!

## 2017-01-03 ENCOUNTER — Ambulatory Visit
Admission: RE | Admit: 2017-01-03 | Discharge: 2017-01-03 | Disposition: A | Discharge status: Home / Self Care | Payer: BLUE CROSS/BLUE SHIELD | Source: Ambulatory Visit | Attending: Family | Admitting: Family

## 2017-01-03 DIAGNOSIS — Z1231 Encounter for screening mammogram for malignant neoplasm of breast: Secondary | ICD-10-CM | POA: Diagnosis not present

## 2017-01-03 DIAGNOSIS — Z0000 Encounter for general adult medical examination without abnormal findings: Secondary | ICD-10-CM

## 2017-01-04 ENCOUNTER — Encounter: Payer: Self-pay | Admitting: Family

## 2017-02-04 ENCOUNTER — Other Ambulatory Visit: Payer: Self-pay | Admitting: Family

## 2017-02-07 NOTE — Telephone Encounter (Signed)
Patient has [facility] to have [facility] [address]  Pt contact [phone] albuterol [facility] HFA

## 2017-03-27 ENCOUNTER — Other Ambulatory Visit: Payer: Self-pay | Admitting: Family

## 2017-03-29 NOTE — Telephone Encounter (Signed)
Patient has requested to have this refilled by tomorrow

## 2017-04-11 ENCOUNTER — Telehealth: Payer: Self-pay | Admitting: *Deleted

## 2017-04-11 ENCOUNTER — Telehealth: Payer: Self-pay | Admitting: Family

## 2017-04-11 DIAGNOSIS — J4520 Mild intermittent asthma, uncomplicated: Secondary | ICD-10-CM

## 2017-04-11 MED ORDER — FLUTICASONE-SALMETEROL 250-50 MCG/DOSE IN AEPB: INHALATION_SPRAY | RESPIRATORY_TRACT | 1 refills | Status: DC

## 2017-04-11 NOTE — Telephone Encounter (Signed)
Patient has requested a call to have clarity on her correct dosage on fluticasone-Salmeterol  Pt contact [phone] A message can be left on voicemail with correct dosage for this medication.

## 2017-04-11 NOTE — Telephone Encounter (Signed)
Pt is having a problem getting her Fluticasone-Salmeterol (ADVAIR DISKUS) 250-50 MCG/DOSE AEPB refilled. Her insurance will cover  A 90 day supply. Please call her at [phone]. Pt is on her way to work.

## 2017-04-11 NOTE — Telephone Encounter (Signed)
Medication has been refilled.

## 2017-04-12 ENCOUNTER — Other Ambulatory Visit: Payer: Self-pay

## 2017-04-12 DIAGNOSIS — J4520 Mild intermittent asthma, uncomplicated: Secondary | ICD-10-CM

## 2017-04-12 MED ORDER — FLUTICASONE-SALMETEROL 250-50 MCG/DOSE IN AEPB: INHALATION_SPRAY | RESPIRATORY_TRACT | 1 refills | Status: DC

## 2017-04-12 NOTE — Telephone Encounter (Signed)
Patient was made aware of dosage.

## 2017-04-13 NOTE — Telephone Encounter (Signed)
Please advise 

## 2017-04-13 NOTE — Telephone Encounter (Signed)
Pt called wanted to know if she can take take both puffs in the morning or should she take one in the morning and one at night. Please advise, thank you!  Call pt @ [phone] (may leave msg)

## 2017-04-15 NOTE — Telephone Encounter (Signed)
Call pt  Correct dosage is one puff every 12 hours  Since the medication has steroids, the goal is treat with lowest dose effective.  If her symptoms are controlled I would advise the above dosage.   If not controlled , please advise OV

## 2017-04-16 NOTE — Telephone Encounter (Signed)
Left detailed message on patient phone to inform patient.

## 2017-05-04 ENCOUNTER — Other Ambulatory Visit: Payer: Self-pay | Admitting: Family

## 2017-06-19 ENCOUNTER — Ambulatory Visit (INDEPENDENT_AMBULATORY_CARE_PROVIDER_SITE_OTHER): Payer: BLUE CROSS/BLUE SHIELD | Admitting: Family

## 2017-06-19 VITALS — BP 118/68 | HR 73 | Temp 98.8°F | Ht 68.0 in | Wt 223.4 lb

## 2017-06-19 DIAGNOSIS — F419 Anxiety disorder, unspecified: Secondary | ICD-10-CM

## 2017-06-19 DIAGNOSIS — J4520 Mild intermittent asthma, uncomplicated: Secondary | ICD-10-CM

## 2017-06-19 MED ORDER — ALBUTEROL SULFATE HFA 108 (90 BASE) MCG/ACT IN AERS: INHALATION_SPRAY | RESPIRATORY_TRACT | 2 refills | Status: DC

## 2017-06-19 MED ORDER — CETIRIZINE HCL 10 MG PO TABS: 10.0000 mg | ORAL_TABLET | Freq: Every day | ORAL | 1 refills | Status: DC

## 2017-06-19 MED ORDER — BUSPIRONE HCL 7.5 MG PO TABS: 7.5000 mg | ORAL_TABLET | Freq: Two times a day (BID) | ORAL | 3 refills | Status: DC

## 2017-06-19 MED ORDER — FLUTICASONE-SALMETEROL 250-50 MCG/DOSE IN AEPB: INHALATION_SPRAY | RESPIRATORY_TRACT | 2 refills | Status: DC

## 2017-06-19 NOTE — Progress Notes (Signed)
Subjective:    Patient ID: Hannah Noble, female    DOB: 1971/06/07, 46 y.o.   MRN: [id number]  CC: Hannah Noble is a 46 y.o. female who presents today for follow up.   HPI:  Reports increased anxiety of late ; she notes that she's lost her job and not sure if she wants to change careers. Very worried about changing insurances. No depression, just feeling "overwhelmed". No thoughts of hurting herself or anyone else.   Asthma- needs inhalers refilled .No wheezing ,coughing. Sometimes feels like like 'cannot get full breath. ' No CP, palpitations.  Using rescue inhaler once daily. Triggers being dust, pet dander. Hasn't been using zyrtec.   Non smoker.        HISTORY:  Past Medical History:  Diagnosis Date  . Asthma    Past Surgical History:  Procedure Laterality Date  . BREAST BIOPSY Right 2010   needle, with Sankar  . ESSURE TUBAL LIGATION  2012   Dr Rayford Halsted  . VAGINAL DELIVERY     X2   Family History  Problem Relation Age of Onset  . COPD Father   . Cancer Maternal Grandfather        melanoma  . Cancer Paternal Grandmother        lung  . Breast cancer Paternal Grandmother 75    Allergies: Patient has no known allergies. Current Outpatient Prescriptions on File Prior to Visit  Medication Sig Dispense Refill  . mupirocin cream (BACTROBAN) 2 % Apply 1 application topically 3 (three) times daily. 15 g 1   No current facility-administered medications on file prior to visit.     Social History  Substance Use Topics  . Smoking status: Former Smoker    Packs/day: 0.50    Types: Cigarettes    Quit date: 09/25/2012  . Smokeless tobacco: Not on file  . Alcohol use Yes     Comment: Rarely    Review of Systems  Constitutional: Negative for chills and fever.  HENT: Positive for postnasal drip.   Respiratory: Negative for cough, shortness of breath and wheezing.   Cardiovascular: Negative for chest pain and palpitations.  Gastrointestinal: Negative for  nausea and vomiting.  Psychiatric/Behavioral: Negative for suicidal ideas. The patient is nervous/anxious.       Objective:    BP 118/68   Pulse 73   Temp 98.8 F (37.1 C) (Oral)   Ht 5\' 8"  (1.727 m)   Wt 223 lb 6.4 oz (101.3 kg)   SpO2 98%   BMI 33.97 kg/m  BP Readings from Last 3 Encounters:  06/19/17 118/68  11/13/16 128/72  11/17/15 111/73   Wt Readings from Last 3 Encounters:  06/19/17 223 lb 6.4 oz (101.3 kg)  11/13/16 257 lb 12.8 oz (116.9 kg)  11/17/15 246 lb 6 oz (111.8 kg)    Physical Exam  Constitutional: She appears well-developed and well-nourished.  Eyes: Conjunctivae are normal.  Cardiovascular: Normal rate, regular rhythm, normal heart sounds and normal pulses.   Pulmonary/Chest: Effort normal and breath sounds normal. She has no wheezes. She has no rhonchi. She has no rales.  Neurological: She is alert.  Skin: Skin is warm and dry.  Psychiatric: She has a normal mood and affect. Her speech is normal and behavior is normal. Thought content normal.  Vitals reviewed.      Assessment & Plan:   Problem List Items Addressed This Visit      Respiratory   Asthma in adult - Primary  Not optimally controlled. No acute respiratory distress today. SPO2 98%.Discussed with patient the need to control the triggers and advised her to start back on Zyrtec daily to see if this helped  her use the rescue inhaler less. We also discussed increasing the steroid component of her Advair. About she'll follow-up with me and let me know if Zyrtec does not help, as next step would be increasing Advair, and/or consult pulmonology.      Relevant Medications   Fluticasone-Salmeterol (ADVAIR DISKUS) 250-50 MCG/DOSE AEPB   albuterol (PROAIR HFA) 108 (90 Base) MCG/ACT inhaler   cetirizine (ZYRTEC) 10 MG tablet     Other   Anxiety    New , worsening. Will trial buspar. Patient will let me know how she is doing.       Relevant Medications   busPIRone (BUSPAR) 7.5 MG tablet        I have changed Ms. Shukla's PROAIR HFA to albuterol. I have also changed her cetirizine. I am also having her start on busPIRone. Additionally, I am having her maintain her mupirocin cream and Fluticasone-Salmeterol.   Meds ordered this encounter  Medications  . Fluticasone-Salmeterol (ADVAIR DISKUS) 250-50 MCG/DOSE AEPB    Sig: INHALE 1 PUFF INTO THE LUNGS 2 (TWO) TIMES DAILY.    Dispense:  180 each    Refill:  2    Order Specific Question:   Supervising Provider    Answer:   Deborra Medina L [[id number]]  . albuterol (PROAIR HFA) 108 (90 Base) MCG/ACT inhaler    Sig: TAKE 2 PUFFS EVERY 6 HOURS AS NEEDED FOR WHEEZING    Dispense:  8.5 Inhaler    Refill:  2    Order Specific Question:   Supervising Provider    Answer:   Deborra Medina L [[id number]]  . cetirizine (ZYRTEC) 10 MG tablet    Sig: Take 1 tablet (10 mg total) by mouth daily.    Dispense:  90 tablet    Refill:  1    Order Specific Question:   Supervising Provider    Answer:   Deborra Medina L [[id number]]  . busPIRone (BUSPAR) 7.5 MG tablet    Sig: Take 1 tablet (7.5 mg total) by mouth 2 (two) times daily.    Dispense:  60 tablet    Refill:  3    Order Specific Question:   Supervising Provider    Answer:   Crecencio Mc [[id number]]    Return precautions given.   Risks, benefits, and alternatives of the medications and treatment plan prescribed today were discussed, and patient expressed understanding.   Education regarding symptom management and diagnosis given to patient on AVS.  Continue to follow with Burnard Hawthorne, FNP for routine health maintenance.   Hannah Noble and I agreed with plan.   Mable Paris, FNP

## 2017-06-19 NOTE — Patient Instructions (Signed)
Trial of buspar  Refilled inhalers- start on daily zyrtec and let me know if symptoms better controlled  Please let me know if you need anything else.     Asthma Attack Prevention, Adult Although you may not be able to control the fact that you have asthma, you can take actions to prevent episodes of asthma (asthma attacks). These actions include:  Creating a written plan for managing and treating your asthma attacks (asthma action plan).  Monitoring your asthma.  Avoiding things that can irritate your airways or make your asthma symptoms worse (asthma triggers).  Taking your medicines as directed.  Acting quickly if you have signs or symptoms of an asthma attack.  What are some ways to prevent an asthma attack? Create a plan Work with your health care provider to create an asthma action plan. This plan should include:  A list of your asthma triggers and how to avoid them.  A list of symptoms that you experience during an asthma attack.  Information about when to take medicine and how much medicine to take.  Information to help you understand your peak flow measurements.  Contact information for your health care providers.  Daily actions that you can take to control asthma.  Monitor your asthma  To monitor your asthma:  Use your peak flow meter every morning and every evening for 2-3 weeks. Record the results in a journal. A drop in your peak flow numbers on one or more days may mean that you are starting to have an asthma attack, even if you are not having symptoms.  When you have asthma symptoms, write them down in a journal.  Avoid asthma triggers  Work with your health care provider to find out what your asthma triggers are. This can be done by:  Being tested for allergies.  Keeping a journal that notes when asthma attacks occur and what may have contributed to them.  Asking your health care provider whether other medical conditions make your asthma  worse.  Common asthma triggers include:  Dust.  Smoke. This includes campfire smoke and secondhand smoke from tobacco products.  Pet dander.  Trees, grasses or pollens.  Very cold, dry, or humid air.  Mold.  Foods that contain high amounts of sulfites.  Strong smells.  Engine exhaust and air pollution.  Aerosol sprays and fumes from household cleaners.  Household pests and their droppings, including dust mites and cockroaches.  Certain medicines, including NSAIDs.  Once you have determined your asthma triggers, take steps to avoid them. Depending on your triggers, you may be able to reduce the chance of an asthma attack by:  Keeping your home clean. Have someone dust and vacuum your home for you 1 or 2 times a week. If possible, have them use a high-efficiency particulate arrestance (HEPA) vacuum.  Washing your sheets weekly in hot water.  Using allergy-proof mattress covers and casings on your bed.  Keeping pets out of your home.  Taking care of mold and water problems in your home.  Avoiding areas where people smoke.  Avoiding using strong perfumes or odor sprays.  Avoid spending a lot of time outdoors when pollen counts are high and on very windy days.  Talking with your health care provider before stopping or starting any new medicines.  Medicines Take over-the-counter and prescription medicines only as told by your health care provider. Many asthma attacks can be prevented by carefully following your medicine schedule. Taking your medicines correctly is especially important when you  cannot avoid certain asthma triggers. Even if you are doing well, do not stop taking your medicine and do not take less medicine. Act quickly If an asthma attack happens, acting quickly can decrease how severe it is and how long it lasts. Take these actions:  Pay attention to your symptoms. If you are coughing, wheezing, or having difficulty breathing, do not wait to see if your  symptoms go away on their own. Follow your asthma action plan.  If you have followed your asthma action plan and your symptoms are not improving, call your health care provider or seek immediate medical care at the nearest hospital.  It is important to write down how often you need to use your fast-acting rescue inhaler. You can track how often you use an inhaler in your journal. If you are using your rescue inhaler more often, it may mean that your asthma is not under control. Adjusting your asthma treatment plan may help you to prevent future asthma attacks and help you to gain better control of your condition. How can I prevent an asthma attack when I exercise?  Exercise is a common asthma trigger. To prevent asthma attacks during exercise:  Follow advice from your health care provider about whether you should use your fast-acting inhaler before exercising. Many people with asthma experience exercise-induced bronchoconstriction (EIB). This condition often worsens during vigorous exercise in cold, humid, or dry environments. Usually, people with EIB can stay very active by using a fast-acting inhaler before exercising.  Avoid exercising outdoors in very cold or humid weather.  Avoid exercising outdoors when pollen counts are high.  Warm up and cool down when exercising.  Stop exercising right away if asthma symptoms start.  Consider taking part in exercises that are less likely to cause asthma symptoms such as:  Indoor swimming.  Biking.  Walking.  Hiking.  Playing football.  This information is not intended to replace advice given to you by your health care provider. Make sure you discuss any questions you have with your health care provider. Document Released: 08/30/2009 Document Revised: 05/12/2016 Document Reviewed: 02/26/2016 Elsevier Interactive Patient Education  2017 [employer].

## 2017-06-20 DIAGNOSIS — F419 Anxiety disorder, unspecified: Secondary | ICD-10-CM | POA: Insufficient documentation

## 2017-06-20 NOTE — Assessment & Plan Note (Addendum)
New , worsening. Will trial buspar. Patient will let me know how she is doing.

## 2017-06-20 NOTE — Assessment & Plan Note (Signed)
Not optimally controlled. No acute respiratory distress today. SPO2 98%.Discussed with patient the need to control the triggers and advised her to start back on Zyrtec daily to see if this helped  her use the rescue inhaler less. We also discussed increasing the steroid component of her Advair. About she'll follow-up with me and let me know if Zyrtec does not help, as next step would be increasing Advair, and/or consult pulmonology.

## 2017-06-21 ENCOUNTER — Other Ambulatory Visit: Payer: Self-pay

## 2017-06-21 DIAGNOSIS — J4520 Mild intermittent asthma, uncomplicated: Secondary | ICD-10-CM

## 2017-06-21 MED ORDER — FLUTICASONE-SALMETEROL 250-50 MCG/DOSE IN AEPB: INHALATION_SPRAY | RESPIRATORY_TRACT | 3 refills | Status: DC

## 2017-07-09 ENCOUNTER — Other Ambulatory Visit: Payer: Self-pay | Admitting: Family

## 2017-07-09 DIAGNOSIS — J4520 Mild intermittent asthma, uncomplicated: Secondary | ICD-10-CM

## 2017-08-21 ENCOUNTER — Other Ambulatory Visit: Payer: Self-pay | Admitting: Family

## 2017-08-21 DIAGNOSIS — J4520 Mild intermittent asthma, uncomplicated: Secondary | ICD-10-CM

## 2017-09-21 ENCOUNTER — Other Ambulatory Visit: Payer: Self-pay

## 2017-09-21 DIAGNOSIS — J4520 Mild intermittent asthma, uncomplicated: Secondary | ICD-10-CM

## 2017-09-21 MED ORDER — CETIRIZINE HCL 10 MG PO TABS: 10.0000 mg | ORAL_TABLET | Freq: Every day | ORAL | 1 refills | Status: DC

## 2017-09-28 ENCOUNTER — Other Ambulatory Visit: Payer: Self-pay

## 2017-09-28 DIAGNOSIS — F419 Anxiety disorder, unspecified: Secondary | ICD-10-CM

## 2017-09-28 MED ORDER — BUSPIRONE HCL 7.5 MG PO TABS: 7.5000 mg | ORAL_TABLET | Freq: Two times a day (BID) | ORAL | 3 refills | Status: AC

## 2017-11-14 ENCOUNTER — Other Ambulatory Visit: Payer: Self-pay | Admitting: Family

## 2017-11-14 DIAGNOSIS — Z0000 Encounter for general adult medical examination without abnormal findings: Secondary | ICD-10-CM

## 2017-11-15 ENCOUNTER — Other Ambulatory Visit: Payer: Self-pay

## 2017-11-15 DIAGNOSIS — J4520 Mild intermittent asthma, uncomplicated: Secondary | ICD-10-CM

## 2017-11-15 MED ORDER — ALBUTEROL SULFATE HFA 108 (90 BASE) MCG/ACT IN AERS: 2.0000 | INHALATION_SPRAY | Freq: Four times a day (QID) | RESPIRATORY_TRACT | 2 refills | Status: DC | PRN

## 2017-11-15 NOTE — Telephone Encounter (Signed)
L/f 2/29/18 Last office visit 2/29/18 No office visit scheduled

## 2018-01-26 ENCOUNTER — Other Ambulatory Visit: Payer: Self-pay | Admitting: Family

## 2018-01-26 DIAGNOSIS — J4520 Mild intermittent asthma, uncomplicated: Secondary | ICD-10-CM

## 2018-07-29 ENCOUNTER — Other Ambulatory Visit: Payer: Self-pay | Admitting: Family

## 2018-07-29 DIAGNOSIS — J4520 Mild intermittent asthma, uncomplicated: Secondary | ICD-10-CM

## 2018-08-05 ENCOUNTER — Other Ambulatory Visit: Payer: Self-pay | Admitting: Family

## 2018-08-05 DIAGNOSIS — J4520 Mild intermittent asthma, uncomplicated: Secondary | ICD-10-CM

## 2018-09-02 ENCOUNTER — Encounter: Payer: BLUE CROSS/BLUE SHIELD | Admitting: Family

## 2018-10-14 ENCOUNTER — Encounter: Payer: Self-pay | Admitting: Family

## 2018-10-14 ENCOUNTER — Other Ambulatory Visit: Payer: Self-pay | Admitting: Radiology

## 2018-10-14 ENCOUNTER — Other Ambulatory Visit (HOSPITAL_COMMUNITY)
Admission: RE | Admit: 2018-10-14 | Discharge: 2018-10-14 | Disposition: A | Discharge status: Home / Self Care | Payer: BLUE CROSS/BLUE SHIELD | Source: Ambulatory Visit | Attending: Family | Admitting: Family

## 2018-10-14 ENCOUNTER — Ambulatory Visit (INDEPENDENT_AMBULATORY_CARE_PROVIDER_SITE_OTHER): Payer: BLUE CROSS/BLUE SHIELD | Admitting: Family

## 2018-10-14 ENCOUNTER — Other Ambulatory Visit: Payer: Self-pay

## 2018-10-14 VITALS — BP 116/78 | HR 75 | Temp 98.0°F | Ht 69.1 in | Wt 255.6 lb

## 2018-10-14 DIAGNOSIS — Z0000 Encounter for general adult medical examination without abnormal findings: Secondary | ICD-10-CM | POA: Insufficient documentation

## 2018-10-14 DIAGNOSIS — J4520 Mild intermittent asthma, uncomplicated: Secondary | ICD-10-CM

## 2018-10-14 DIAGNOSIS — E669 Obesity, unspecified: Secondary | ICD-10-CM

## 2018-10-14 DIAGNOSIS — B354 Tinea corporis: Secondary | ICD-10-CM | POA: Insufficient documentation

## 2018-10-14 LAB — COMPREHENSIVE METABOLIC PANEL
ALT: 14 U/L (ref 0–35)
AST: 16 U/L (ref 0–37)
Albumin: 4.2 g/dL (ref 3.5–5.2)
Alkaline Phosphatase: 38 U/L — ABNORMAL LOW (ref 39–117)
BUN: 13 mg/dL (ref 6–23)
CO2: 25 mEq/L (ref 19–32)
Calcium: 9.1 mg/dL (ref 8.4–10.5)
Chloride: 103 mEq/L (ref 96–112)
Creatinine, Ser: 0.73 mg/dL (ref 0.40–1.20)
GFR: 86.07 mL/min (ref >60.00)
Glucose, Bld: 93 mg/dL (ref 70–99)
Potassium: 4.1 mEq/L (ref 3.5–5.1)
Sodium: 137 mEq/L (ref 135–145)
Total Bilirubin: 0.5 mg/dL (ref 0.2–1.2)
Total Protein: 7.6 g/dL (ref 6.0–8.3)

## 2018-10-14 LAB — CBC WITH DIFFERENTIAL/PLATELET
Basophils Absolute: 0.1 10*3/uL (ref 0.0–0.1)
Basophils Relative: 0.9 % (ref 0.0–3.0)
Eosinophils Absolute: 0.2 10*3/uL (ref 0.0–0.7)
Eosinophils Relative: 3.8 % (ref 0.0–5.0)
HCT: 36.9 % (ref 36.0–46.0)
Hemoglobin: 12.6 g/dL (ref 12.0–15.0)
Lymphocytes Relative: 22.3 % (ref 12.0–46.0)
Lymphs Abs: 1.3 10*3/uL (ref 0.7–4.0)
MCHC: 34 g/dL (ref 30.0–36.0)
MCV: 87.6 fl (ref 78.0–100.0)
Monocytes Absolute: 0.5 10*3/uL (ref 0.1–1.0)
Monocytes Relative: 8.3 % (ref 3.0–12.0)
Neutro Abs: 3.9 10*3/uL (ref 1.4–7.7)
Neutrophils Relative %: 64.7 % (ref 43.0–77.0)
Platelets: 305 10*3/uL (ref 150.0–400.0)
RBC: 4.22 Mil/uL (ref 3.87–5.11)
RDW: 13.9 % (ref 11.5–15.5)
WBC: 6 10*3/uL (ref 4.0–10.5)

## 2018-10-14 LAB — HEMOGLOBIN A1C: Hgb A1c MFr Bld: 5.5 % (ref 4.6–6.5)

## 2018-10-14 LAB — VITAMIN D 25 HYDROXY (VIT D DEFICIENCY, FRACTURES): VITD: 32.44 ng/mL (ref 30.00–100.00)

## 2018-10-14 LAB — LIPID PANEL
Cholesterol: 168 mg/dL (ref 0–200)
HDL: 59.2 mg/dL (ref >39.00)
LDL Cholesterol: 90 mg/dL (ref 0–99)
NonHDL: 109.23
Total CHOL/HDL Ratio: 3
Triglycerides: 96 mg/dL (ref 0.0–149.0)
VLDL: 19.2 mg/dL (ref 0.0–40.0)

## 2018-10-14 LAB — TSH: TSH: 1.41 u[IU]/mL (ref 0.35–4.50)

## 2018-10-14 MED ORDER — ALBUTEROL SULFATE HFA 108 (90 BASE) MCG/ACT IN AERS: INHALATION_SPRAY | RESPIRATORY_TRACT | 5 refills | Status: DC

## 2018-10-14 MED ORDER — FLUCONAZOLE 150 MG PO TABS: 150.0000 mg | ORAL_TABLET | Freq: Once | ORAL | 0 refills | Status: AC

## 2018-10-14 NOTE — Progress Notes (Signed)
Patient notified to call [place].

## 2018-10-14 NOTE — Addendum Note (Signed)
Addended by: Burnard Hawthorne on: 10/14/2018 10:42 AM   Modules accepted: Orders

## 2018-10-14 NOTE — Assessment & Plan Note (Signed)
Education about on low-carb diet.  Encouraged exercise.  Declines Wellbutrin at this time

## 2018-10-14 NOTE — Patient Instructions (Addendum)
Nice to see you!  Suspect tinea corporis, which is a fungal infection.Fluconazole 150 once weekly for two to four weeks   This is  Dr. Lupita Dawn  example of a  "Low GI"  Diet:  It will allow you to lose 4 to 8  lbs  per month if you follow it carefully.  Your goal with exercise is a minimum of 30 minutes of aerobic exercise 5 days per week (Walking does not count once it becomes easy!)    All of the foods can be found at grocery stores and in bulk at [employer].  The Atkins protein bars and shakes are available in more varieties at [employer], [employer] and [employer].     7 AM Breakfast:  Choose from the following:  Low carbohydrate Protein  Shakes (I recommend the  Premier Protein chocolate shakes,  EAS AdvantEdge "Carb Control" shakes  Or the Atkins shakes all are under 3 net carbs)     a scrambled egg/bacon/cheese burrito made with Mission's "carb balance" whole wheat tortilla  (about 10 net carbs )  Regulatory affairs officer (basically a quiche without the pastry crust) that is eaten cold and very convenient way to get your eggs.  8 carbs)  If you make your own protein shakes, avoid bananas and pineapple,  And use low carb greek yogurt or original /unsweetened almond or soy milk    Avoid cereal and bananas, oatmeal and cream of wheat and grits. They are loaded with carbohydrates!   10 AM: high protein snack:  Protein bar by Atkins (the snack size, under 200 cal, usually < 6 net carbs).    A stick of cheese:  Around 1 carb,  100 cal     Dannon Light n Fit [place] Yogurt  (80 cal, 8 carbs)  Other so called "protein bars" and Greek yogurts tend to be loaded with carbohydrates.  Remember, in food advertising, the word "energy" is synonymous for " carbohydrate."  Lunch:   A Sandwich using the bread choices listed, Can use any  Eggs,  lunchmeat, grilled meat or canned tuna), avocado, regular mayo/mustard  and cheese.  A Salad using blue cheese, ranch,  Goddess or vinagrette,  Avoid  taco shells, croutons or "confetti" and no "candied nuts" but regular nuts OK.   No pretzels, nabs  or chips.  Pickles and miniature sweet peppers are a good low carb alternative that provide a "crunch"  The bread is the only source of carbohydrate in a sandwich and  can be decreased by trying some of the attached alternatives to traditional loaf bread   Avoid "Low fat dressings, as well as [place] dressings They are loaded with sugar!   3 PM/ Mid day  Snack:  Consider  1 ounce of  almonds, walnuts, pistachios, pecans, peanuts,  Macadamia nuts or a nut medley.  Avoid "granola and granola bars "  Mixed nuts are ok in moderation as long as there are no raisins,  cranberries or dried fruit.   KIND bars are OK if you get the low glycemic index variety   Try the prosciutto/mozzarella cheese sticks by [employer]  In deli /backery section   High protein      6 PM  Dinner:     Meat/fowl/fish with a green salad, and either broccoli, cauliflower, green beans, spinach, brussel sprouts or  Lima beans. DO NOT BREAD THE PROTEIN!!      There is a low carb pasta by Dreamfield's  that is acceptable and tastes great: only 5 digestible carbs/serving.( All grocery stores but BJs carry it ) Several ready made meals are available low carb:   Try Michel Angelo's chicken piccata or chicken or eggplant parm over low carb pasta.(Lowes and BJs)   Marjory Lies Sanchez's "Carnitas" (pulled pork, no sauce,  0 carbs) or his beef pot roast to make a dinner burrito (at [employer]'s)  Pesto over low carb pasta (bj's sells a good quality pesto in the center refrigerated section of the deli   Try satueeing  Cheral Marker with mushroooms as a good side   [employer] Giant makes a mashed cauliflower that tastes like mashed potatoes  Whole wheat pasta is still full of digestible carbs and  Not as low in glycemic index as Dreamfield's.   Brown rice is still rice,  So skip the rice and noodles if you eat [place] or [place] (or at least limit  to 1/2 cup)  9 PM snack :   Breyer's "low carb" fudgsicle or  ice cream bar (Carb Smart line), or  Weight Watcher's ice cream bar , or another "no sugar added" ice cream;  a serving of fresh berries/cherries with whipped cream   Cheese or DANNON'S LlGHT N FIT GREEK YOGURT  8 ounces of [place] unsweetened almond/cococunut milk    Treat yourself to a parfait made with whipped cream blueberiies, walnuts and vanilla greek yogurt  Avoid bananas, pineapple, grapes  and watermelon on a regular basis because they are high in sugar.  THINK OF THEM AS DESSERT  Remember that snack Substitutions should be less than 10 NET carbs per serving and meals < 20 carbs. Remember to subtract fiber grams to get the "net carbs."  @TULLOBREADPACKAGE @   Body Ringworm Body ringworm is an infection of the skin that often causes a ring-shaped rash. Body ringworm can affect any part of your skin. It can spread easily to others. Body ringworm is also called tinea corporis. What are the causes? This condition is caused by funguses called dermatophytes. The condition develops when these funguses grow out of control on the skin. You can get this condition if you touch a person or animal that has it. You can also get it if you share clothing, bedding, towels, or any other object with an infected person or pet. What increases the risk? This condition is more likely to develop in:  Athletes who often make skin-to-skin contact with other athletes, such as wrestlers.  People who share equipment and mats.  People with a weakened immune system. What are the signs or symptoms? Symptoms of this condition include:  Itchy, raised red spots and bumps.  Red scaly patches.  A ring-shaped rash. The rash may have: ? A clear center. ? Scales or red bumps at its center. ? Redness near its borders. ? Dry and scaly skin on or around it. How is this diagnosed? This condition can usually be diagnosed with a skin exam. A skin  scraping may be taken from the affected area and examined under a microscope to see if the fungus is present. How is this treated? This condition may be treated with:  An antifungal cream or ointment.  An antifungal shampoo.  Antifungal medicines. These may be prescribed if your ringworm is severe, keeps coming back, or lasts a long time. Follow these instructions at home:  Take over-the-counter and prescription medicines only as told by your health care provider.  If you were given an antifungal cream or ointment: ?  Use it as told by your health care provider. ? Wash the infected area and dry it completely before applying the cream or ointment.  If you were given an antifungal shampoo: ? Use it as told by your health care provider. ? Leave the shampoo on your body for 3-5 minutes before rinsing.  While you have a rash: ? Wear loose clothing to stop clothes from rubbing and irritating it. ? Wash or change your bed sheets every night.  If your pet has the same infection, take your pet to see a Animal nutritionist. How is this prevented?  Practice good hygiene.  Wear sandals or shoes in public places and showers.  Do not share personal items with others.  Avoid touching red patches of skin on other people.  Avoid touching pets that have bald spots.  If you touch an animal that has a bald spot, wash your hands. Contact a health care provider if:  Your rash continues to spread after 7 days of treatment.  Your rash is not gone in 4 weeks.  The area around your rash gets red, warm, tender, and swollen. This information is not intended to replace advice given to you by your health care provider. Make sure you discuss any questions you have with your health care provider. Document Released: 09/08/2000 Document Revised: 02/17/2016 Document Reviewed: 07/08/2015 Elsevier Interactive Patient Education  2019 [employer].

## 2018-10-14 NOTE — Assessment & Plan Note (Signed)
Declines flu, tdap, pneumonia vaccine. Pap and CBE performed.

## 2018-10-14 NOTE — Progress Notes (Signed)
Subjective:    Patient ID: Hannah Noble, female    DOB: [birth year], 48 y.o.   MRN: [id number]  CC: Hannah Noble is a 48 y.o. female who presents today for physical exam.    HPI: Feels well today Anxiety has resolved; happy at work.   Has rash under bilateral thighs, red in color, for months, waxes and wanes.  Has been using hydrocortisone cream with some relief. No new lotions, creams. Itchy . Not painful. No purulent discharge.   Asthma- well controlled. Every one in a while will have some chest tightness or wheezing, resolves with albuterol. Not exercise induced. No CP.   Has gained weight - dietary indiscretion. Plans to use fit bit again to track eating.    Colorectal Cancer Screening: No early family history.  Breast Cancer Screening: Mammogram due Cervical Cancer Screening: due Bone Health screening/DEXA for 65+: No increased fracture risk. Defer screening at this time. Lung Cancer Screening: Doesn't have 30 year pack year history and age > 30 years. Immunizations       Tetanus - due; declines        Pneumococcal - Candidate for. declines  Labs: Screening labs today. Exercise: no regular exercise.  Alcohol use: rare Smoking/tobacco use: former smoker.  Regular dental exams: In need of dental exam. Wears seat belt: Yes.  HISTORY:  Past Medical History:  Diagnosis Date  . Asthma     Past Surgical History:  Procedure Laterality Date  . BREAST BIOPSY Right 2010   needle, with Sankar  . ESSURE TUBAL LIGATION  2012   Dr Rayford Halsted  . VAGINAL DELIVERY     X2   Family History  Problem Relation Age of Onset  . COPD Father   . Cancer Maternal Grandfather        melanoma  . Cancer Paternal Grandmother        lung  . Breast cancer Paternal Grandmother 67  . Colon cancer Neg Hx       ALLERGIES: Patient has no known allergies.  Current Outpatient Medications on File Prior to Visit  Medication Sig Dispense Refill  . ADVAIR DISKUS 250-50 MCG/DOSE AEPB  TAKE 1 PUFF BY MOUTH TWICE A DAY 180 each 0  . Fluticasone-Salmeterol (ADVAIR DISKUS) 250-50 MCG/DOSE AEPB TAKE 1 PUFF BY MOUTH TWICE A DAY 180 each 0   No current facility-administered medications on file prior to visit.     Social History   Tobacco Use  . Smoking status: Former Smoker    Packs/day: 0.50    Types: Cigarettes    Last attempt to quit: 09/25/2012    Years since quitting: 6.0  . Smokeless tobacco: Never Used  Substance Use Topics  . Alcohol use: Yes    Comment: Rarely  . Drug use: No    Review of Systems  Constitutional: Negative for chills, fever and unexpected weight change.  HENT: Negative for congestion.   Respiratory: Negative for cough, shortness of breath and wheezing.   Cardiovascular: Negative for chest pain, palpitations and leg swelling.  Gastrointestinal: Negative for nausea and vomiting.  Musculoskeletal: Negative for arthralgias and myalgias.  Skin: Positive for rash.  Neurological: Negative for headaches.  Hematological: Negative for adenopathy.  Psychiatric/Behavioral: Negative for confusion.      Objective:    BP 116/78 (BP Location: Left Arm, Patient Position: Sitting, Cuff Size: Large)   Pulse 75   Temp 98 F (36.7 C)   Ht 5' 9.1" (1.755 m)   Wt 255 lb  9.6 oz (115.9 kg)   SpO2 98%   BMI 37.64 kg/m   BP Readings from Last 3 Encounters:  10/14/18 116/78  06/19/17 118/68  11/13/16 128/72   Wt Readings from Last 3 Encounters:  10/14/18 255 lb 9.6 oz (115.9 kg)  06/19/17 223 lb 6.4 oz (101.3 kg)  11/13/16 257 lb 12.8 oz (116.9 kg)    Physical Exam Vitals signs reviewed.  Constitutional:      Appearance: She is well-developed.  Eyes:     Conjunctiva/sclera: Conjunctivae normal.  Neck:     Thyroid: No thyroid mass or thyromegaly.  Cardiovascular:     Rate and Rhythm: Normal rate and regular rhythm.     Pulses: Normal pulses.     Heart sounds: Normal heart sounds.  Pulmonary:     Effort: Pulmonary effort is normal.      Breath sounds: Normal breath sounds. No wheezing, rhonchi or rales.  Chest:     Breasts: Breasts are symmetrical.        Right: No inverted nipple, mass, nipple discharge, skin change or tenderness.        Left: No inverted nipple, mass, nipple discharge, skin change or tenderness.  Genitourinary:    Cervix: No cervical motion tenderness, discharge or friability.     Uterus: Not enlarged, not fixed and not tender.      Adnexa:        Right: No mass, tenderness or fullness.         Left: No mass, tenderness or fullness.       Comments: Pap performed. No CMT. Unable to appreciated ovaries. Lymphadenopathy:     Head:     Right side of head: No submental, submandibular, tonsillar, preauricular, posterior auricular or occipital adenopathy.     Left side of head: No submental, submandibular, tonsillar, preauricular, posterior auricular or occipital adenopathy.     Cervical:     Right cervical: No superficial, deep or posterior cervical adenopathy.    Left cervical: No superficial, deep or posterior cervical adenopathy.     Upper Body:     Right upper body: No pectoral adenopathy.     Left upper body: No pectoral adenopathy.  Skin:    General: Skin is warm and dry.          Comments: Hyper erythematous well-demarcated rash is noted on diagram.  Central clearing.  No vesicular lesions, papules noted.  No increased warmth.  Neurological:     Mental Status: She is alert.  Psychiatric:        Speech: Speech normal.        Behavior: Behavior normal.        Thought Content: Thought content normal.        Assessment & Plan:   Problem List Items Addressed This Visit      Musculoskeletal and Integument   Tinea corporis    Presentation is consistent with tinea.  Based on widespread rash, will treat with oral antifungal.  Close follow-up.      Relevant Medications   fluconazole (DIFLUCAN) 150 MG tablet     Other   Routine general medical examination at a health care facility - Primary     Declines flu, tdap, pneumonia vaccine. Pap and CBE performed.       Relevant Orders   Cytology - PAP   TSH   Comprehensive metabolic panel   CBC with Differential/Platelet   Hemoglobin A1c   Lipid panel   VITAMIN D 25 Hydroxy (Vit-D Deficiency,  Fractures)   MM 3D SCREEN BREAST BILATERAL   Obesity (BMI 30-39.9)    Education about on low-carb diet.  Encouraged exercise.  Declines Wellbutrin at this time          I have discontinued Anasofia L. Sackrider "Latera Menser"'s cetirizine and mupirocin cream. I am also having her start on fluconazole. Additionally, I am having her maintain her ADVAIR DISKUS and Fluticasone-Salmeterol.   Meds ordered this encounter  Medications  . fluconazole (DIFLUCAN) 150 MG tablet    Sig: Take 1 tablet (150 mg total) by mouth once for 1 dose. May repeat in ONE week if not resolved.    Dispense:  4 tablet    Refill:  0    Order Specific Question:   Supervising Provider    Answer:   Crecencio Mc [[id number]]    Return precautions given.   Risks, benefits, and alternatives of the medications and treatment plan prescribed today were discussed, and patient expressed understanding.   Education regarding symptom management and diagnosis given to patient on AVS.   Continue to follow with Burnard Hawthorne, FNP for routine health maintenance.   Hannah Noble and I agreed with plan.   Mable Paris, FNP

## 2018-10-14 NOTE — Assessment & Plan Note (Signed)
Presentation is consistent with tinea.  Based on widespread rash, will treat with oral antifungal.  Close follow-up.

## 2018-10-18 LAB — CYTOLOGY - PAP
Adequacy: ABSENT
Diagnosis: NEGATIVE
HPV: NOT DETECTED

## 2018-10-28 ENCOUNTER — Encounter: Payer: Self-pay | Admitting: Family

## 2018-10-28 ENCOUNTER — Ambulatory Visit (INDEPENDENT_AMBULATORY_CARE_PROVIDER_SITE_OTHER): Payer: BLUE CROSS/BLUE SHIELD | Admitting: Family

## 2018-10-28 ENCOUNTER — Other Ambulatory Visit: Payer: Self-pay | Admitting: Family

## 2018-10-28 VITALS — BP 122/70 | HR 73 | Temp 98.3°F | Wt 254.6 lb

## 2018-10-28 DIAGNOSIS — B372 Candidiasis of skin and nail: Secondary | ICD-10-CM

## 2018-10-28 MED ORDER — FLUCONAZOLE 150 MG PO TABS: 150.0000 mg | ORAL_TABLET | Freq: Once | ORAL | 1 refills | Status: DC

## 2018-10-28 NOTE — Patient Instructions (Signed)
Another dose of diflucan. 150mg  once per week for 3 more weeks  Let me know if not resolved  Today we discussed referrals, orders. dermatolology   I have placed these orders in the system for you.  Please be sure to give [place] a call if you have not heard from our office regarding this. We should hear from [place] within ONE week with information regarding your appointment. If not, please let me know immediately.

## 2018-10-28 NOTE — Progress Notes (Signed)
Subjective:    Patient ID: Hannah Noble, female    DOB: 1971-06-23, 48 y.o.   MRN: [id number]  CC: Hannah Noble is a 48 y.o. female who presents today for follow up.   HPI: Rash has improved. Still has line along thighs.   No vesciular lesions. No purulent discharge. No fever, chills, N, V.   Feels well otherwise.   Took one dose of diflucan.stopped using cortizone.       HISTORY:  Past Medical History:  Diagnosis Date  . Asthma    Past Surgical History:  Procedure Laterality Date  . BREAST BIOPSY Right 2010   needle, with Sankar  . ESSURE TUBAL LIGATION  2012   Dr Rayford Halsted  . VAGINAL DELIVERY     X2   Family History  Problem Relation Age of Onset  . COPD Father   . Cancer Maternal Grandfather        melanoma  . Cancer Paternal Grandmother        lung  . Breast cancer Paternal Grandmother 21  . Colon cancer Neg Hx     Allergies: Patient has no known allergies. Current Outpatient Medications on File Prior to Visit  Medication Sig Dispense Refill  . ADVAIR DISKUS 250-50 MCG/DOSE AEPB TAKE 1 PUFF BY MOUTH TWICE A DAY 180 each 0  . albuterol (PROAIR HFA) 108 (90 Base) MCG/ACT inhaler TAKE 2 PUFFS BY MOUTH EVERY 6 HOURS AS NEEDED FOR WHEEZE OR SHORTNESS OF BREATH 8.5 Inhaler 5  . Fluticasone-Salmeterol (ADVAIR DISKUS) 250-50 MCG/DOSE AEPB TAKE 1 PUFF BY MOUTH TWICE A DAY 180 each 0   No current facility-administered medications on file prior to visit.     Social History   Tobacco Use  . Smoking status: Former Smoker    Packs/day: 0.50    Types: Cigarettes    Last attempt to quit: 09/25/2012    Years since quitting: 6.0  . Smokeless tobacco: Never Used  Substance Use Topics  . Alcohol use: Yes    Comment: Rarely  . Drug use: No    Review of Systems  Constitutional: Negative for chills and fever.  Respiratory: Negative for cough.   Cardiovascular: Negative for chest pain and palpitations.  Gastrointestinal: Negative for nausea and vomiting.    Skin: Positive for rash.      Objective:    BP 122/70 (BP Location: Left Arm, Patient Position: Sitting, Cuff Size: Large)   Pulse 73   Temp 98.3 F (36.8 C)   Wt 254 lb 9.6 oz (115.5 kg)   SpO2 97%   BMI 37.49 kg/m  BP Readings from Last 3 Encounters:  10/28/18 122/70  10/14/18 116/78  06/19/17 118/68   Wt Readings from Last 3 Encounters:  10/28/18 254 lb 9.6 oz (115.5 kg)  10/14/18 255 lb 9.6 oz (115.9 kg)  06/19/17 223 lb 6.4 oz (101.3 kg)    Physical Exam Vitals signs reviewed.  Constitutional:      Appearance: She is well-developed.  Eyes:     Conjunctiva/sclera: Conjunctivae normal.  Cardiovascular:     Rate and Rhythm: Normal rate and regular rhythm.     Pulses: Normal pulses.     Heart sounds: Normal heart sounds.  Pulmonary:     Effort: Pulmonary effort is normal.     Breath sounds: Normal breath sounds. No wheezing, rhonchi or rales.  Skin:    General: Skin is warm and dry.     Findings: Rash present. No bruising or wound. Rash is  not vesicular.          Comments: Erythema has improved. Well demarcated line. New satellite lesion left anterior thigh.   Neurological:     Mental Status: She is alert.  Psychiatric:        Speech: Speech normal.        Behavior: Behavior normal.        Thought Content: Thought content normal.        Assessment & Plan:   Problem List Items Addressed This Visit      Musculoskeletal and Integument   Candidal intertrigo - Primary    Improved. Trial of diflucan for weekly, for 3 more weeks . If not better , patient will see dermatology ( referral placed). She will let me know if she is not better.       Relevant Medications   fluconazole (DIFLUCAN) 150 MG tablet   Other Relevant Orders   Ambulatory referral to Dermatology       I am having Hannah Noble start on fluconazole. I am also having her maintain her ADVAIR DISKUS, Fluticasone-Salmeterol, and albuterol.   Meds ordered this encounter  Medications   . fluconazole (DIFLUCAN) 150 MG tablet    Sig: Take 1 tablet (150 mg total) by mouth once for 1 dose.    Dispense:  3 tablet    Refill:  1    Order Specific Question:   Supervising Provider    Answer:   Crecencio Mc [[id number]]    Return precautions given.   Risks, benefits, and alternatives of the medications and treatment plan prescribed today were discussed, and patient expressed understanding.   Education regarding symptom management and diagnosis given to patient on AVS.  Continue to follow with Burnard Hawthorne, FNP for routine health maintenance.   Hannah Noble and I agreed with plan.   Mable Paris, FNP

## 2018-10-28 NOTE — Telephone Encounter (Signed)
Copied from [place] [id number]. Topic: Quick Communication - Rx Refill/Question >> Oct 28, 2018  5:16 PM Alanda Slim E wrote: Medication: fluconazole (DIFLUCAN) 150 MG tablet  Has the patient contacted their pharmacy? Yes  Pharmacy had server issues and did not receive the Rx / please resend    Preferred Pharmacy (with phone number or street name): [id number] [address], [place] [phone] (Phone) [phone] (Fax)    Agent: Please be advised that RX refills may take up to 3 business days. We ask that you follow-up with your pharmacy.

## 2018-10-28 NOTE — Assessment & Plan Note (Signed)
Improved. Trial of diflucan for weekly, for 3 more weeks . If not better , patient will see dermatology ( referral placed). She will let me know if she is not better.

## 2018-10-29 MED ORDER — FLUCONAZOLE 150 MG PO TABS: 150.0000 mg | ORAL_TABLET | Freq: Once | ORAL | 1 refills | Status: DC

## 2018-10-29 NOTE — Telephone Encounter (Signed)
Medication resent

## 2018-10-30 ENCOUNTER — Ambulatory Visit: Payer: Self-pay

## 2018-10-30 NOTE — Telephone Encounter (Signed)
Incoming call from Patient requesting clarification on how to take her medication.  Reviewed Patient char of last visit on 10/28/18 with  Margarett G Arnette.FNP .  Plan relates that  Patient is to  Take 150mg  of  Diflucan once per week for 3 more weeks. Patient voices clear understanding of clarification with gratitude.    Answer Assessment - Initial Assessment Questions 1. SYMPTOMS: "Do you have any symptoms?"     Patient has question related to medication 2. SEVERITY: If symptoms are present, ask "Are they mild, moderate or severe?"     mild  Protocols used: MEDICATION QUESTION CALL-A-AH

## 2018-11-11 ENCOUNTER — Ambulatory Visit
Admission: RE | Admit: 2018-11-11 | Discharge: 2018-11-11 | Disposition: A | Discharge status: Home / Self Care | Payer: BLUE CROSS/BLUE SHIELD | Source: Ambulatory Visit | Attending: Family | Admitting: Family

## 2018-11-11 DIAGNOSIS — Z0000 Encounter for general adult medical examination without abnormal findings: Secondary | ICD-10-CM

## 2018-11-11 DIAGNOSIS — Z1231 Encounter for screening mammogram for malignant neoplasm of breast: Secondary | ICD-10-CM | POA: Insufficient documentation

## 2018-12-24 ENCOUNTER — Other Ambulatory Visit: Payer: Self-pay | Admitting: Family

## 2018-12-24 DIAGNOSIS — J4520 Mild intermittent asthma, uncomplicated: Secondary | ICD-10-CM

## 2019-02-14 ENCOUNTER — Other Ambulatory Visit: Payer: Self-pay | Admitting: Family

## 2019-02-14 DIAGNOSIS — J4520 Mild intermittent asthma, uncomplicated: Secondary | ICD-10-CM

## 2019-03-05 ENCOUNTER — Other Ambulatory Visit: Payer: Self-pay | Admitting: Family

## 2019-03-05 DIAGNOSIS — B372 Candidiasis of skin and nail: Secondary | ICD-10-CM

## 2019-03-07 NOTE — Telephone Encounter (Signed)
Call pt Refilled diflucan However if she is still having concerns for yeast infection ; last ov 09/2018; please make a f/u doxy with me.   We discuss other measures, reoccurrence.

## 2019-03-07 NOTE — Telephone Encounter (Signed)
Patient notified script was sent. If she continues to have symptoms she will schedule Doxy.

## 2019-03-13 ENCOUNTER — Other Ambulatory Visit: Payer: Self-pay | Admitting: Family

## 2019-03-13 DIAGNOSIS — J4520 Mild intermittent asthma, uncomplicated: Secondary | ICD-10-CM

## 2019-05-28 ENCOUNTER — Other Ambulatory Visit: Payer: Self-pay | Admitting: Family

## 2019-05-28 DIAGNOSIS — J4520 Mild intermittent asthma, uncomplicated: Secondary | ICD-10-CM

## 2019-05-29 ENCOUNTER — Other Ambulatory Visit: Payer: Self-pay | Admitting: Family

## 2019-05-29 DIAGNOSIS — J4520 Mild intermittent asthma, uncomplicated: Secondary | ICD-10-CM

## 2019-07-16 ENCOUNTER — Other Ambulatory Visit: Payer: Self-pay | Admitting: Family

## 2019-07-16 DIAGNOSIS — J4520 Mild intermittent asthma, uncomplicated: Secondary | ICD-10-CM

## 2019-09-02 ENCOUNTER — Other Ambulatory Visit: Payer: Self-pay | Admitting: Family

## 2019-09-02 DIAGNOSIS — J4520 Mild intermittent asthma, uncomplicated: Secondary | ICD-10-CM

## 2019-09-11 ENCOUNTER — Telehealth: Payer: Self-pay | Admitting: Family

## 2019-09-12 NOTE — Telephone Encounter (Signed)
close

## 2019-09-28 ENCOUNTER — Other Ambulatory Visit: Payer: Self-pay | Admitting: Family

## 2019-09-28 DIAGNOSIS — J4520 Mild intermittent asthma, uncomplicated: Secondary | ICD-10-CM

## 2019-10-18 ENCOUNTER — Other Ambulatory Visit: Payer: Self-pay | Admitting: Family

## 2019-10-18 DIAGNOSIS — J4520 Mild intermittent asthma, uncomplicated: Secondary | ICD-10-CM

## 2019-10-31 ENCOUNTER — Other Ambulatory Visit: Payer: Self-pay

## 2019-10-31 ENCOUNTER — Telehealth: Payer: Self-pay | Admitting: Family

## 2019-10-31 DIAGNOSIS — J4520 Mild intermittent asthma, uncomplicated: Secondary | ICD-10-CM

## 2019-10-31 MED ORDER — ALBUTEROL SULFATE HFA 108 (90 BASE) MCG/ACT IN AERS: INHALATION_SPRAY | RESPIRATORY_TRACT | 1 refills | Status: DC

## 2019-10-31 NOTE — Telephone Encounter (Signed)
Prescription sent to pharmacy.

## 2019-10-31 NOTE — Telephone Encounter (Signed)
Pt is requesting a refill on her albuterol (VENTOLIN HFA) 108 (90 Base) MCG/ACT inhaler.

## 2019-11-18 ENCOUNTER — Ambulatory Visit (INDEPENDENT_AMBULATORY_CARE_PROVIDER_SITE_OTHER): Payer: BC Managed Care – PPO | Admitting: Family

## 2019-11-18 ENCOUNTER — Encounter: Payer: Self-pay | Admitting: Family

## 2019-11-18 ENCOUNTER — Other Ambulatory Visit: Payer: Self-pay

## 2019-11-18 VITALS — BP 128/68 | HR 84 | Temp 98.4°F | Resp 16 | Ht 69.0 in | Wt 250.6 lb

## 2019-11-18 DIAGNOSIS — J4520 Mild intermittent asthma, uncomplicated: Secondary | ICD-10-CM | POA: Diagnosis not present

## 2019-11-18 DIAGNOSIS — B372 Candidiasis of skin and nail: Secondary | ICD-10-CM | POA: Diagnosis not present

## 2019-11-18 DIAGNOSIS — Z0000 Encounter for general adult medical examination without abnormal findings: Secondary | ICD-10-CM | POA: Diagnosis not present

## 2019-11-18 DIAGNOSIS — L739 Follicular disorder, unspecified: Secondary | ICD-10-CM | POA: Diagnosis not present

## 2019-11-18 DIAGNOSIS — Z23 Encounter for immunization: Secondary | ICD-10-CM | POA: Diagnosis not present

## 2019-11-18 LAB — CBC WITH DIFFERENTIAL/PLATELET
Basophils Absolute: 0 10*3/uL (ref 0.0–0.1)
Basophils Relative: 0.7 % (ref 0.0–3.0)
Eosinophils Absolute: 0.1 10*3/uL (ref 0.0–0.7)
Eosinophils Relative: 2.6 % (ref 0.0–5.0)
HCT: 34.1 % — ABNORMAL LOW (ref 36.0–46.0)
Hemoglobin: 11.5 g/dL — ABNORMAL LOW (ref 12.0–15.0)
Lymphocytes Relative: 16.4 % (ref 12.0–46.0)
Lymphs Abs: 0.9 10*3/uL (ref 0.7–4.0)
MCHC: 33.8 g/dL (ref 30.0–36.0)
MCV: 87.3 fl (ref 78.0–100.0)
Monocytes Absolute: 0.3 10*3/uL (ref 0.1–1.0)
Monocytes Relative: 5.4 % (ref 3.0–12.0)
Neutro Abs: 4.2 10*3/uL (ref 1.4–7.7)
Neutrophils Relative %: 74.9 % (ref 43.0–77.0)
Platelets: 297 10*3/uL (ref 150.0–400.0)
RBC: 3.9 Mil/uL (ref 3.87–5.11)
RDW: 14.1 % (ref 11.5–15.5)
WBC: 5.6 10*3/uL (ref 4.0–10.5)

## 2019-11-18 LAB — COMPREHENSIVE METABOLIC PANEL
ALT: 13 U/L (ref 0–35)
AST: 13 U/L (ref 0–37)
Albumin: 4 g/dL (ref 3.5–5.2)
Alkaline Phosphatase: 34 U/L — ABNORMAL LOW (ref 39–117)
BUN: 11 mg/dL (ref 6–23)
CO2: 26 mEq/L (ref 19–32)
Calcium: 8.6 mg/dL (ref 8.4–10.5)
Chloride: 105 mEq/L (ref 96–112)
Creatinine, Ser: 0.72 mg/dL (ref 0.40–1.20)
GFR: 87.03 mL/min (ref >60.00)
Glucose, Bld: 95 mg/dL (ref 70–99)
Potassium: 3.9 mEq/L (ref 3.5–5.1)
Sodium: 137 mEq/L (ref 135–145)
Total Bilirubin: 0.4 mg/dL (ref 0.2–1.2)
Total Protein: 6.8 g/dL (ref 6.0–8.3)

## 2019-11-18 LAB — VITAMIN D 25 HYDROXY (VIT D DEFICIENCY, FRACTURES): VITD: 26.08 ng/mL — ABNORMAL LOW (ref 30.00–100.00)

## 2019-11-18 LAB — LIPID PANEL
Cholesterol: 150 mg/dL (ref 0–200)
HDL: 57.6 mg/dL (ref >39.00)
LDL Cholesterol: 81 mg/dL (ref 0–99)
NonHDL: 92.43
Total CHOL/HDL Ratio: 3
Triglycerides: 58 mg/dL (ref 0.0–149.0)
VLDL: 11.6 mg/dL (ref 0.0–40.0)

## 2019-11-18 LAB — TSH: TSH: 1.33 u[IU]/mL (ref 0.35–4.50)

## 2019-11-18 MED ORDER — CLOTRIMAZOLE 1 % EX OINT: 1.0000 "application " | TOPICAL_OINTMENT | Freq: Two times a day (BID) | CUTANEOUS | 1 refills | Status: DC

## 2019-11-18 MED ORDER — MUPIROCIN 2 % EX OINT: 1.0000 "application " | TOPICAL_OINTMENT | Freq: Two times a day (BID) | CUTANEOUS | 0 refills | Status: DC | PRN

## 2019-11-18 MED ORDER — DOXYCYCLINE HYCLATE 100 MG PO TABS: 100.0000 mg | ORAL_TABLET | Freq: Two times a day (BID) | ORAL | 0 refills | Status: DC

## 2019-11-18 NOTE — Patient Instructions (Signed)
You may use clotrimazole for suspected yeast infection in the groin, please keep the area dry. In regards to recurrent folliculitis or boils, please use Dial soap in the areas.  Please keep dry as well.  You may use the prescription ointment in this area as needed.  You may start doxycycline today.  Please ensure you are eating probiotics. Also placed a referral to dermatology for further evaluation which I think is important. Please schedule mammogram as discussed. Stay safe!  Health Maintenance, Female Adopting a healthy lifestyle and getting preventive care are important in promoting health and wellness. Ask your health care provider about:  The right schedule for you to have regular tests and exams.  Things you can do on your own to prevent diseases and keep yourself healthy. What should I know about diet, weight, and exercise? Eat a healthy diet   Eat a diet that includes plenty of vegetables, fruits, low-fat dairy products, and lean protein.  Do not eat a lot of foods that are high in solid fats, added sugars, or sodium. Maintain a healthy weight Body mass index (BMI) is used to identify weight problems. It estimates body fat based on height and weight. Your health care provider can help determine your BMI and help you achieve or maintain a healthy weight. Get regular exercise Get regular exercise. This is one of the most important things you can do for your health. Most adults should:  Exercise for at least 150 minutes each week. The exercise should increase your heart rate and make you sweat (moderate-intensity exercise).  Do strengthening exercises at least twice a week. This is in addition to the moderate-intensity exercise.  Spend less time sitting. Even light physical activity can be beneficial. Watch cholesterol and blood lipids Have your blood tested for lipids and cholesterol at 47 years of age, then have this test every 5 years. Have your cholesterol levels checked more  often if:  Your lipid or cholesterol levels are high.  You are older than 47 years of age.  You are at high risk for heart disease. What should I know about cancer screening? Depending on your health history and family history, you may need to have cancer screening at various ages. This may include screening for:  Breast cancer.  Cervical cancer.  Colorectal cancer.  Skin cancer.  Lung cancer. What should I know about heart disease, diabetes, and high blood pressure? Blood pressure and heart disease  High blood pressure causes heart disease and increases the risk of stroke. This is more likely to develop in people who have high blood pressure readings, are of African descent, or are overweight.  Have your blood pressure checked: ? Every 3-5 years if you are 25-68 years of age. ? Every year if you are 81 years old or older. Diabetes Have regular diabetes screenings. This checks your fasting blood sugar level. Have the screening done:  Once every three years after age 64 if you are at a normal weight and have a low risk for diabetes.  More often and at a younger age if you are overweight or have a high risk for diabetes. What should I know about preventing infection? Hepatitis B If you have a higher risk for hepatitis B, you should be screened for this virus. Talk with your health care provider to find out if you are at risk for hepatitis B infection. Hepatitis C Testing is recommended for:  Everyone born from [birth year] through 1963.  Anyone with known risk factors  for hepatitis C. Sexually transmitted infections (STIs)  Get screened for STIs, including gonorrhea and chlamydia, if: ? You are sexually active and are younger than 47 years of age. ? You are older than 47 years of age and your health care provider tells you that you are at risk for this type of infection. ? Your sexual activity has changed since you were last screened, and you are at increased risk for chlamydia  or gonorrhea. Ask your health care provider if you are at risk.  Ask your health care provider about whether you are at high risk for HIV. Your health care provider may recommend a prescription medicine to help prevent HIV infection. If you choose to take medicine to prevent HIV, you should first get tested for HIV. You should then be tested every 3 months for as long as you are taking the medicine. Pregnancy  If you are about to stop having your period (premenopausal) and you may become pregnant, seek counseling before you get pregnant.  Take 400 to 800 micrograms (mcg) of folic acid every day if you become pregnant.  Ask for birth control (contraception) if you want to prevent pregnancy. Osteoporosis and menopause Osteoporosis is a disease in which the bones lose minerals and strength with aging. This can result in bone fractures. If you are 41 years old or older, or if you are at risk for osteoporosis and fractures, ask your health care provider if you should:  Be screened for bone loss.  Take a calcium or vitamin D supplement to lower your risk of fractures.  Be given hormone replacement therapy (HRT) to treat symptoms of menopause. Follow these instructions at home: Lifestyle  Do not use any products that contain nicotine or tobacco, such as cigarettes, e-cigarettes, and chewing tobacco. If you need help quitting, ask your health care provider.  Do not use street drugs.  Do not share needles.  Ask your health care provider for help if you need support or information about quitting drugs. Alcohol use  Do not drink alcohol if: ? Your health care provider tells you not to drink. ? You are pregnant, may be pregnant, or are planning to become pregnant.  If you drink alcohol: ? Limit how much you use to 0-1 drink a day. ? Limit intake if you are breastfeeding.  Be aware of how much alcohol is in your drink. In the [place] one drink equals one 12 oz bottle of beer (355 mL), one 5 oz  glass of wine (148 mL), or one 1 oz glass of hard liquor (44 mL). General instructions  Schedule regular health, dental, and eye exams.  Stay current with your vaccines.  Tell your health care provider if: ? You often feel depressed. ? You have ever been abused or do not feel safe at home. Summary  Adopting a healthy lifestyle and getting preventive care are important in promoting health and wellness.  Follow your health care provider's instructions about healthy diet, exercising, and getting tested or screened for diseases.  Follow your health care provider's instructions on monitoring your cholesterol and blood pressure. This information is not intended to replace advice given to you by your health care provider. Make sure you discuss any questions you have with your health care provider. Document Revised: 09/04/2018 Document Reviewed: 09/04/2018 Elsevier Patient Education  2020 [employer].

## 2019-11-18 NOTE — Assessment & Plan Note (Addendum)
Exacerbated.  Concern for folliculitis versus inflammatory nodules such as seen with Hidradenitis suppurativa.  I do not do not think incision drain would be appropriate due to the multiple lesions that I see , and that they appear nonfluctuant. We have agreed to treat with doxycycline, Bactroban and advise Dial soap as well as keeping the areas clean and dry.  Refer to dermatology for further evaluation.

## 2019-11-18 NOTE — Assessment & Plan Note (Addendum)
Patient due for mammogram, patient understands to schedule this.  Pap smear is up-to-date.  Patient does not have any pelvic complaints and we have deferred pelvic exam today.  Screening labs ordered.  Encourage starting formal exercise program.  Patient will return for pneumococcal 23 vaccine

## 2019-11-18 NOTE — Progress Notes (Signed)
Subjective:    Patient ID: Hannah Noble, female    DOB: 1971-07-05, 49 y.o.   MRN: [id number]  CC: Hannah Noble is a 49 y.o. female who presents today for physical exam.    HPI:  She does complain that she continues to get rash from time to time under her left area of the stomach.  She tries to keep this area dry. It  is red and itchy when present.  She would like a refill of the cream she has had in the past.  She also states that she does continue to suffer from little bumps, most notably near her right breast and under her armpits.  Occasionally should she will have purulent discharge from these areas.  She is never seen dermatology for this.  Asthma- no difficulty breathing or cough.  Primarily triggered by dust that is when she does cleaning.  Using her Advair every day.  No shortness of breath   Colorectal Cancer Screening: no early family history of.  Breast Cancer Screening: Mammogram due Cervical Cancer Screening: UTD; no pelvic pain or concerns Flu - due       Tetanus - due        Pneumococcal - Candidate for.  Labs: Screening labs today. Exercise: Gets regular exercise.  Alcohol use: rarely Smoking/tobacco use: former smoker.   No formal exercise. No history of skin cancer  HISTORY:  Past Medical History:  Diagnosis Date  . Asthma     Past Surgical History:  Procedure Laterality Date  . BREAST BIOPSY Right 2010   needle, with Sankar  . ESSURE TUBAL LIGATION  2012   Dr Rayford Halsted  . VAGINAL DELIVERY     X2   Family History  Problem Relation Age of Onset  . COPD Father   . Cancer Maternal Grandfather        melanoma  . Cancer Paternal Grandmother        lung  . Breast cancer Paternal Grandmother 62  . Colon cancer Neg Hx       ALLERGIES: Patient has no known allergies.  Current Outpatient Medications on File Prior to Visit  Medication Sig Dispense Refill  . ADVAIR DISKUS 250-50 MCG/DOSE AEPB TAKE 1 PUFF BY MOUTH TWICE A DAY 180 each 0  .  albuterol (VENTOLIN HFA) 108 (90 Base) MCG/ACT inhaler TAKE TWO PUFFS BY MOUTH EVERY 6 HOURS AS NEEDED FOR WHEEZING AND SHORTNESS OF BREATH. 8.5 g 1   No current facility-administered medications on file prior to visit.    Social History   Tobacco Use  . Smoking status: Former Smoker    Packs/day: 0.50    Types: Cigarettes    Quit date: 09/25/2012    Years since quitting: 7.1  . Smokeless tobacco: Never Used  Substance Use Topics  . Alcohol use: Yes    Comment: Rarely  . Drug use: No    Review of Systems  Constitutional: Negative for chills, fever and unexpected weight change.  HENT: Negative for congestion.   Respiratory: Negative for cough.   Cardiovascular: Negative for chest pain, palpitations and leg swelling.  Gastrointestinal: Negative for nausea and vomiting.  Musculoskeletal: Negative for arthralgias and myalgias.  Skin: Negative for rash.  Neurological: Negative for headaches.  Hematological: Negative for adenopathy.  Psychiatric/Behavioral: Negative for confusion.      Objective:    BP 128/68   Pulse 84   Temp 98.4 F (36.9 C)   Resp 16   Ht 5\' 9"  (1.753  m)   Wt 250 lb 9.6 oz (113.7 kg)   SpO2 99%   BMI 37.01 kg/m   BP Readings from Last 3 Encounters:  11/18/19 128/68  10/28/18 122/70  10/14/18 116/78   Wt Readings from Last 3 Encounters:  11/18/19 250 lb 9.6 oz (113.7 kg)  10/28/18 254 lb 9.6 oz (115.5 kg)  10/14/18 255 lb 9.6 oz (115.9 kg)    Physical Exam Vitals reviewed.  Constitutional:      Appearance: She is well-developed.  Eyes:     Conjunctiva/sclera: Conjunctivae normal.  Neck:     Thyroid: No thyroid mass or thyromegaly.  Cardiovascular:     Rate and Rhythm: Normal rate and regular rhythm.     Pulses: Normal pulses.     Heart sounds: Normal heart sounds.  Pulmonary:     Effort: Pulmonary effort is normal.     Breath sounds: Normal breath sounds. No wheezing, rhonchi or rales.  Chest:     Breasts: Breasts are symmetrical.         Right: No inverted nipple, mass, nipple discharge, skin change or tenderness.        Left: No inverted nipple, mass, nipple discharge, skin change or tenderness.  Lymphadenopathy:     Head:     Right side of head: No submental, submandibular, tonsillar, preauricular, posterior auricular or occipital adenopathy.     Left side of head: No submental, submandibular, tonsillar, preauricular, posterior auricular or occipital adenopathy.     Cervical: No cervical adenopathy.     Right cervical: No superficial, deep or posterior cervical adenopathy.    Left cervical: No superficial, deep or posterior cervical adenopathy.  Skin:    General: Skin is warm and dry.          Comments: Well-demarcated approximately 2 to 3 cm nodule noted.  Surrounding erythema.  No purulent discharge.  Tender Bilateral axilla noted small, scattered papules with purulent discharge.  No swelling. Non fluctuant.   Neurological:     Mental Status: She is alert.  Psychiatric:        Speech: Speech normal.        Behavior: Behavior normal.        Thought Content: Thought content normal.        Assessment & Plan:   Problem List Items Addressed This Visit      Respiratory   Asthma in adult    Stable, continue current regimen        Musculoskeletal and Integument   Candidal intertrigo   Relevant Medications   mupirocin ointment (BACTROBAN) 2 %   Clotrimazole 1 % OINT   Folliculitis    Exacerbated.  Concern for folliculitis versus inflammatory nodules such as seen with Hidradenitis suppurativa.  I do not do not think incision drain would be appropriate due to the multiple lesions that I see , and that they appear nonfluctuant. We have agreed to treat with doxycycline, Bactroban and advise Dial soap as well as keeping the areas clean and dry.  Refer to dermatology for further evaluation.      Relevant Medications   doxycycline (VIBRA-TABS) 100 MG tablet   mupirocin ointment (BACTROBAN) 2 %   Other Relevant  Orders   Ambulatory referral to Dermatology     Other   Routine general medical examination at a health care facility - Primary    Patient due for mammogram, patient understands to schedule this.  Pap smear is up-to-date.  Patient does not have any pelvic complaints  and we have deferred pelvic exam today.  Screening labs ordered.  Encourage starting formal exercise program.  Patient will return for pneumococcal 23 vaccine      Relevant Orders   MM 3D SCREEN BREAST BILATERAL   TSH   CBC with Differential/Platelet   Comprehensive metabolic panel   Hemoglobin A1c   Lipid panel   VITAMIN D 25 Hydroxy (Vit-D Deficiency, Fractures)    Other Visit Diagnoses    Need for Tdap vaccination       Relevant Orders   Tdap vaccine greater than or equal to 7yo IM (Completed)   Need for influenza vaccination       Relevant Orders   Flu Vaccine QUAD 6+ mos PF IM (Fluarix Quad PF) (Completed)       I have discontinued Ineze L. Vanderberg's fluconazole and Fluticasone-Salmeterol. I am also having her start on doxycycline, mupirocin ointment, and Clotrimazole. Additionally, I am having her maintain her Advair Diskus and albuterol.   Meds ordered this encounter  Medications  . doxycycline (VIBRA-TABS) 100 MG tablet    Sig: Take 1 tablet (100 mg total) by mouth 2 (two) times daily.    Dispense:  10 tablet    Refill:  0    Order Specific Question:   Supervising Provider    Answer:   Deborra Medina L [[id number]]  . mupirocin ointment (BACTROBAN) 2 %    Sig: Apply 1 application topically 2 (two) times daily as needed. Between breasts, under arm pits as needed.    Dispense:  22 g    Refill:  0    Order Specific Question:   Supervising Provider    Answer:   Derrel Nip, TERESA L [[id number]]  . Clotrimazole 1 % OINT    Sig: Apply 1 application topically 2 (two) times daily. To the groin area for yeast infection    Dispense:  56.7 g    Refill:  1    Order Specific Question:   Supervising Provider    Answer:    Crecencio Mc [[id number]]    Return precautions given.   Risks, benefits, and alternatives of the medications and treatment plan prescribed today were discussed, and patient expressed understanding.   Education regarding symptom management and diagnosis given to patient on AVS.   Continue to follow with Burnard Hawthorne, FNP for routine health maintenance.   Hannah Noble and I agreed with plan.   Mable [place], FNP

## 2019-11-18 NOTE — Assessment & Plan Note (Signed)
Stable, continue current regimen 

## 2019-11-20 LAB — HEMOGLOBIN A1C: Hgb A1c MFr Bld: 5.9 % (ref 4.6–6.5)

## 2019-11-23 ENCOUNTER — Other Ambulatory Visit: Payer: Self-pay | Admitting: Family

## 2019-11-23 DIAGNOSIS — R899 Unspecified abnormal finding in specimens from other organs, systems and tissues: Secondary | ICD-10-CM

## 2019-11-25 ENCOUNTER — Other Ambulatory Visit: Payer: Self-pay | Admitting: Family

## 2019-11-25 DIAGNOSIS — L739 Follicular disorder, unspecified: Secondary | ICD-10-CM

## 2019-11-26 NOTE — Telephone Encounter (Signed)
Call patient We received a request for refill of doxycycline.  We do not typically refill antibiotics unless there is reason to do so.  Please get more information here.  I prescribed this for concern for abscess, folliculitis. Has she gotten a call in regards dermatology?

## 2019-11-26 NOTE — Telephone Encounter (Signed)
Refill request for Doxycycline, last seen & last filled 11-18-19.  Please advise.

## 2019-11-27 ENCOUNTER — Other Ambulatory Visit: Payer: Self-pay

## 2019-11-27 NOTE — Telephone Encounter (Signed)
I called patient & she requested this on accident from her [facility] app. She was thinking that this was a different medication.

## 2019-12-16 ENCOUNTER — Ambulatory Visit (INDEPENDENT_AMBULATORY_CARE_PROVIDER_SITE_OTHER): Payer: BC Managed Care – PPO | Admitting: *Deleted

## 2019-12-16 ENCOUNTER — Other Ambulatory Visit: Payer: Self-pay

## 2019-12-16 DIAGNOSIS — Z23 Encounter for immunization: Secondary | ICD-10-CM | POA: Diagnosis not present

## 2019-12-21 ENCOUNTER — Other Ambulatory Visit: Payer: Self-pay | Admitting: Family

## 2019-12-21 DIAGNOSIS — J4520 Mild intermittent asthma, uncomplicated: Secondary | ICD-10-CM

## 2019-12-23 ENCOUNTER — Encounter: Payer: Self-pay | Admitting: Family

## 2020-01-12 ENCOUNTER — Ambulatory Visit: Payer: BC Managed Care – PPO | Admitting: Dermatology

## 2020-01-13 ENCOUNTER — Other Ambulatory Visit (INDEPENDENT_AMBULATORY_CARE_PROVIDER_SITE_OTHER): Payer: BC Managed Care – PPO

## 2020-01-13 ENCOUNTER — Other Ambulatory Visit: Payer: Self-pay

## 2020-01-13 ENCOUNTER — Telehealth: Payer: Self-pay | Admitting: Family

## 2020-01-13 DIAGNOSIS — R899 Unspecified abnormal finding in specimens from other organs, systems and tissues: Secondary | ICD-10-CM | POA: Diagnosis not present

## 2020-01-13 LAB — CBC WITH DIFFERENTIAL/PLATELET
Basophils Absolute: 0 10*3/uL (ref 0.0–0.1)
Basophils Relative: 0.6 % (ref 0.0–3.0)
Eosinophils Absolute: 0.3 10*3/uL (ref 0.0–0.7)
Eosinophils Relative: 3.6 % (ref 0.0–5.0)
HCT: 33.6 % — ABNORMAL LOW (ref 36.0–46.0)
Hemoglobin: 11.3 g/dL — ABNORMAL LOW (ref 12.0–15.0)
Lymphocytes Relative: 18.7 % (ref 12.0–46.0)
Lymphs Abs: 1.3 10*3/uL (ref 0.7–4.0)
MCHC: 33.5 g/dL (ref 30.0–36.0)
MCV: 87.3 fl (ref 78.0–100.0)
Monocytes Absolute: 0.5 10*3/uL (ref 0.1–1.0)
Monocytes Relative: 7.5 % (ref 3.0–12.0)
Neutro Abs: 4.8 10*3/uL (ref 1.4–7.7)
Neutrophils Relative %: 69.6 % (ref 43.0–77.0)
Platelets: 287 10*3/uL (ref 150.0–400.0)
RBC: 3.85 Mil/uL — ABNORMAL LOW (ref 3.87–5.11)
RDW: 14.4 % (ref 11.5–15.5)
WBC: 6.9 10*3/uL (ref 4.0–10.5)

## 2020-01-13 NOTE — Telephone Encounter (Signed)
Patient was scheduled to come in on 01/12/20 and cancelled and did not reschedule. [place] 01/08/20

## 2020-01-14 NOTE — Telephone Encounter (Signed)
noted 

## 2020-01-16 ENCOUNTER — Ambulatory Visit: Payer: BC Managed Care – PPO | Attending: Internal Medicine

## 2020-01-16 DIAGNOSIS — Z23 Encounter for immunization: Secondary | ICD-10-CM

## 2020-01-16 NOTE — Progress Notes (Signed)
   Covid-19 Vaccination Clinic  Name:  Hannah Noble    MRN: [id number] DOB: 04/24/1971  01/16/2020  Ms. Hannah Noble was observed post Covid-19 immunization for 15 minutes without incident. She was provided with Vaccine Information Sheet and instruction to access the V-Safe system.   Ms. Hannah Noble was instructed to call 911 with any severe reactions post vaccine: Marland Kitchen Difficulty breathing  . Swelling of face and throat  . A fast heartbeat  . A bad rash all over body  . Dizziness and weakness   Immunizations Administered    Name Date Dose VIS Date Route   Pfizer COVID-19 Vaccine 01/16/2020  9:03 AM 0.3 mL 11/19/2018 Intramuscular   Manufacturer: [place]   Lot: [id number]   [place]: [id number]

## 2020-01-20 ENCOUNTER — Other Ambulatory Visit: Payer: Self-pay | Admitting: Family

## 2020-01-20 DIAGNOSIS — D649 Anemia, unspecified: Secondary | ICD-10-CM

## 2020-01-25 ENCOUNTER — Other Ambulatory Visit: Payer: Self-pay | Admitting: Family

## 2020-01-25 DIAGNOSIS — L739 Follicular disorder, unspecified: Secondary | ICD-10-CM

## 2020-01-28 ENCOUNTER — Telehealth: Payer: Self-pay | Admitting: Family

## 2020-01-28 NOTE — Telephone Encounter (Signed)
Rejection Reason - Patient Declined - patient cancelled her scheduled appointment and did not reschedule " Rossie said about 3 hours ago

## 2020-02-02 ENCOUNTER — Other Ambulatory Visit: Payer: Self-pay | Admitting: Family

## 2020-02-02 DIAGNOSIS — L739 Follicular disorder, unspecified: Secondary | ICD-10-CM

## 2020-02-10 ENCOUNTER — Ambulatory Visit: Payer: BC Managed Care – PPO | Attending: Internal Medicine

## 2020-02-10 DIAGNOSIS — Z23 Encounter for immunization: Secondary | ICD-10-CM

## 2020-02-10 NOTE — Progress Notes (Signed)
   Covid-19 Vaccination Clinic  Name:  Hannah Noble    MRN: [id number] DOB: 12/31/1970  02/10/2020  Hannah Noble was observed post Covid-19 immunization for 15 minutes without incident. She was provided with Vaccine Information Sheet and instruction to access the V-Safe system.   Hannah Noble was instructed to call 911 with any severe reactions post vaccine: Marland Kitchen Difficulty breathing  . Swelling of face and throat  . A fast heartbeat  . A bad rash all over body  . Dizziness and weakness   Immunizations Administered    Name Date Dose VIS Date Route   Pfizer COVID-19 Vaccine 02/10/2020  9:03 AM 0.3 mL 11/19/2018 Intramuscular   Manufacturer: [place]   Lot: [id number]   [place]: [id number]

## 2020-02-18 ENCOUNTER — Other Ambulatory Visit: Payer: Self-pay | Admitting: Family

## 2020-02-18 DIAGNOSIS — J4520 Mild intermittent asthma, uncomplicated: Secondary | ICD-10-CM

## 2020-03-10 ENCOUNTER — Ambulatory Visit (INDEPENDENT_AMBULATORY_CARE_PROVIDER_SITE_OTHER): Payer: BC Managed Care – PPO | Admitting: Gastroenterology

## 2020-03-10 ENCOUNTER — Encounter: Payer: Self-pay | Admitting: Gastroenterology

## 2020-03-10 ENCOUNTER — Other Ambulatory Visit: Payer: Self-pay

## 2020-03-10 VITALS — BP 131/71 | HR 76 | Temp 98.6°F | Ht 69.0 in | Wt 232.6 lb

## 2020-03-10 DIAGNOSIS — D509 Iron deficiency anemia, unspecified: Secondary | ICD-10-CM

## 2020-03-10 DIAGNOSIS — Z1211 Encounter for screening for malignant neoplasm of colon: Secondary | ICD-10-CM

## 2020-03-11 LAB — FERRITIN: Ferritin: 13 ng/mL — ABNORMAL LOW (ref 15–150)

## 2020-03-11 LAB — IRON AND TIBC
Iron Saturation: 16 % (ref 15–55)
Iron: 60 ug/dL (ref 27–159)
Total Iron Binding Capacity: 387 ug/dL (ref 250–450)
UIBC: 327 ug/dL (ref 131–425)

## 2020-03-11 NOTE — Progress Notes (Signed)
Hannah Noble, [place] [address]  Main: [phone]  Fax: [phone]   Gastroenterology Consultation  Referring Provider:     Burnard Hawthorne, FNP Primary Care Physician:  Burnard Hawthorne, FNP Reason for Consultation:    Anemia        HPI:    Chief Complaint  Patient presents with  . New Patient (Initial Visit)    Anemia    Hannah Noble is a 49 y.o. y/o female referred for consultation & management  by Dr. Vidal Schwalbe, Yvetta Coder, FNP.  Patient presents for evaluation of normocytic anemia. The patient denies abdominal or flank pain, anorexia, nausea or vomiting, dysphagia, change in bowel habits or black or bloody stools or weight loss. No family history of colon cancer.  No prior EGD or colonoscopy  Past Medical History:  Diagnosis Date  . Asthma     Past Surgical History:  Procedure Laterality Date  . BREAST BIOPSY Right 2010   needle, with Sankar  . ESSURE TUBAL LIGATION  2012   Dr Rayford Halsted  . VAGINAL DELIVERY     X2    Prior to Admission medications   Medication Sig Start Date End Date Taking? Authorizing Provider  ADVAIR DISKUS 250-50 MCG/DOSE AEPB TAKE 1 PUFF BY MOUTH TWICE A DAY 07/09/17  Yes Burnard Hawthorne, FNP  albuterol (VENTOLIN HFA) 108 (90 Base) MCG/ACT inhaler TAKE TWO PUFFS BY MOUTH EVERY 6 HOURS AS NEEDED FOR WHEEZING AND SHORTNESS OF BREATH. 02/18/20  Yes Arnett, Yvetta Coder, FNP  Clotrimazole 1 % OINT Apply 1 application topically 2 (two) times daily. To the groin area for yeast infection Patient not taking: Reported on 03/10/2020 11/18/19   Burnard Hawthorne, FNP  doxycycline (VIBRA-TABS) 100 MG tablet Take 1 tablet (100 mg total) by mouth 2 (two) times daily. Patient not taking: Reported on 03/10/2020 11/18/19   Burnard Hawthorne, FNP  mupirocin ointment (BACTROBAN) 2 % APPLY 1 APPLICATION TOPICALLY 2 TIMES DAILY AS NEEDED. BETWEEN BREASTS, UNDER ARM PITS AS NEEDED. Patient not taking: Reported  on 03/10/2020 02/02/20   Burnard Hawthorne, FNP    Family History  Problem Relation Age of Onset  . COPD Father   . Cancer Maternal Grandfather        melanoma  . Cancer Paternal Grandmother        lung  . Breast cancer Paternal Grandmother 29  . Colon cancer Neg Hx      Social History   Tobacco Use  . Smoking status: Former Smoker    Packs/day: 0.50    Types: Cigarettes    Quit date: 09/25/2012    Years since quitting: 7.4  . Smokeless tobacco: Never Used  Substance Use Topics  . Alcohol use: Yes    Comment: Rarely  . Drug use: No    Allergies as of 03/10/2020  . (No Known Allergies)    Review of Systems:    All systems reviewed and negative except where noted in HPI.   Physical Exam:  BP 131/71   Pulse 76   Temp 98.6 F (37 C) (Oral)   Ht 5\' 9"  (1.753 m)   Wt 232 lb 9.6 oz (105.5 kg)   BMI 34.35 kg/m  No LMP recorded. Psych:  Alert and cooperative. Normal mood and affect. General:   Alert,  Well-developed, well-nourished, pleasant and cooperative in NAD Head:  Normocephalic and atraumatic. Eyes:  Sclera clear, no icterus.   Conjunctiva pink.  Ears:  Normal auditory acuity. Nose:  No deformity, discharge, or lesions. Mouth:  No deformity or lesions,oropharynx pink & moist. Neck:  Supple; no masses or thyromegaly. Abdomen:  Normal bowel sounds.  No bruits.  Soft, non-tender and non-distended without masses, hepatosplenomegaly or hernias noted.  No guarding or rebound tenderness.    Msk:  Symmetrical without gross deformities. Good, equal movement & strength bilaterally. Pulses:  Normal pulses noted. Extremities:  No clubbing or edema.  No cyanosis. Neurologic:  Alert and oriented x3;  grossly normal neurologically. Skin:  Intact without significant lesions or rashes. No jaundice. Lymph Nodes:  No significant cervical adenopathy. Psych:  Alert and cooperative. Normal mood and affect.   Labs: CBC    Component Value Date/Time   WBC 6.9 01/13/2020 0917    RBC 3.85 (L) 01/13/2020 0917   HGB 11.3 (L) 01/13/2020 0917   HCT 33.6 (L) 01/13/2020 0917   PLT 287.0 01/13/2020 0917   MCV 87.3 01/13/2020 0917   MCHC 33.5 01/13/2020 0917   RDW 14.4 01/13/2020 0917   LYMPHSABS 1.3 01/13/2020 0917   MONOABS 0.5 01/13/2020 0917   EOSABS 0.3 01/13/2020 0917   BASOSABS 0.0 01/13/2020 0917   CMP     Component Value Date/Time   NA 137 11/18/2019 0934   NA 136 (A) 06/25/2010 0744   K 3.9 11/18/2019 0934   CL 105 11/18/2019 0934   CO2 26 11/18/2019 0934   GLUCOSE 95 11/18/2019 0934   BUN 11 11/18/2019 0934   BUN 14 06/25/2010 0744   CREATININE 0.72 11/18/2019 0934   CALCIUM 8.6 11/18/2019 0934   PROT 6.8 11/18/2019 0934   ALBUMIN 4.0 11/18/2019 0934   AST 13 11/18/2019 0934   ALT 13 11/18/2019 0934   ALKPHOS 34 (L) 11/18/2019 0934   BILITOT 0.4 11/18/2019 0934    Imaging Studies: No results found.  Assessment and Plan:   Hannah Noble is a 49 y.o. y/o female has been referred for normocytic anemia  Obtain iron panel and if patient has iron deficiency, she would qualify for both EGD and colonoscopy  Colonoscopy due for screening as well  I have discussed alternative options, risks & benefits,  which include, but are not limited to, bleeding, infection, perforation,respiratory complication & drug reaction.  The patient agrees with this plan & written consent will be obtained.       Dr Hannah [place]  Speech recognition software was used to dictate the above note.

## 2020-03-15 ENCOUNTER — Telehealth: Payer: Self-pay | Admitting: Gastroenterology

## 2020-03-15 NOTE — Telephone Encounter (Signed)
Patient is scheduled for a colonoscopy on 03-25-2020 for DR Tahiliani. Please call & go over the preps for the colonoscopy & it does need to be called into her pharmacy.Marland Kitchen

## 2020-03-15 NOTE — Telephone Encounter (Signed)
Returned patients phone call LVM for her to call back to let me know where to send her prescription bowel prep to if she would like me to send it.  Thanks,  [place], [place]

## 2020-03-15 NOTE — Telephone Encounter (Signed)
Patient called & l/m on v/m stating she has questions on a procedure she is to have in a couple of weeks. Please advise.

## 2020-03-16 ENCOUNTER — Other Ambulatory Visit: Payer: Self-pay

## 2020-03-16 DIAGNOSIS — Z1211 Encounter for screening for malignant neoplasm of colon: Secondary | ICD-10-CM

## 2020-03-16 MED ORDER — PEG 3350-KCL-NA BICARB-NACL 420 G PO SOLR
4000.0000 mL | Freq: Once | ORAL | 0 refills | Status: AC
Start: 2020-03-16 — End: 2020-03-16

## 2020-03-17 ENCOUNTER — Other Ambulatory Visit: Payer: Self-pay

## 2020-03-17 ENCOUNTER — Telehealth: Payer: Self-pay

## 2020-03-17 DIAGNOSIS — D509 Iron deficiency anemia, unspecified: Secondary | ICD-10-CM

## 2020-03-17 NOTE — Telephone Encounter (Signed)
-----   Message from Virgel Manifold, MD sent at 03/15/2020 10:35 AM EDT ----- Herb Grays please let the patient know, her labs show iron deficiency.  Please refer her to hematology, Dr. Janese Banks.  Please also schedule for EGD for iron deficiency anemia along with her colonoscopy for screening

## 2020-03-17 NOTE — Telephone Encounter (Signed)
Called patient and left her a detailed voicemail letting her know that she is iron deficient and that she will be referred to see Dr. Janese Banks. I also told her to call me back since Dr. Bonna Gains would like for her to have an EGD and Colonoscopy. Now waiting on patient to call me back to schedule her procedures. Patient called back and she agreed on adding the EGD. This was communicated to [place].

## 2020-03-23 ENCOUNTER — Other Ambulatory Visit
Admission: RE | Admit: 2020-03-23 | Discharge: 2020-03-23 | Disposition: A | Discharge status: Home / Self Care | Payer: BC Managed Care – PPO | Source: Ambulatory Visit | Attending: Gastroenterology | Admitting: Gastroenterology

## 2020-03-23 ENCOUNTER — Other Ambulatory Visit: Payer: Self-pay

## 2020-03-23 ENCOUNTER — Encounter: Payer: Self-pay | Admitting: Oncology

## 2020-03-23 ENCOUNTER — Inpatient Hospital Stay: Payer: BC Managed Care – PPO

## 2020-03-23 ENCOUNTER — Inpatient Hospital Stay: Payer: BC Managed Care – PPO | Attending: Oncology | Admitting: Oncology

## 2020-03-23 VITALS — BP 122/82 | HR 80 | Temp 98.2°F | Resp 18 | Wt 234.8 lb

## 2020-03-23 DIAGNOSIS — Z79899 Other long term (current) drug therapy: Secondary | ICD-10-CM | POA: Insufficient documentation

## 2020-03-23 DIAGNOSIS — J45909 Unspecified asthma, uncomplicated: Secondary | ICD-10-CM | POA: Insufficient documentation

## 2020-03-23 DIAGNOSIS — N920 Excessive and frequent menstruation with regular cycle: Secondary | ICD-10-CM | POA: Diagnosis not present

## 2020-03-23 DIAGNOSIS — D509 Iron deficiency anemia, unspecified: Secondary | ICD-10-CM | POA: Diagnosis not present

## 2020-03-23 DIAGNOSIS — E669 Obesity, unspecified: Secondary | ICD-10-CM | POA: Diagnosis not present

## 2020-03-23 DIAGNOSIS — Z6830 Body mass index (BMI) 30.0-30.9, adult: Secondary | ICD-10-CM | POA: Insufficient documentation

## 2020-03-23 DIAGNOSIS — Z20822 Contact with and (suspected) exposure to covid-19: Secondary | ICD-10-CM | POA: Diagnosis not present

## 2020-03-23 DIAGNOSIS — Z87891 Personal history of nicotine dependence: Secondary | ICD-10-CM | POA: Insufficient documentation

## 2020-03-23 DIAGNOSIS — Z01812 Encounter for preprocedural laboratory examination: Secondary | ICD-10-CM | POA: Diagnosis not present

## 2020-03-23 HISTORY — DX: Iron deficiency anemia, unspecified: D50.9

## 2020-03-23 LAB — SARS CORONAVIRUS 2 (TAT 6-24 HRS): SARS Coronavirus 2: NEGATIVE

## 2020-03-23 NOTE — Progress Notes (Signed)
Hematology/Oncology Consult note [facility] [phone] [phone]  Patient Care Team: Burnard Hawthorne, FNP as PCP - General (Family Medicine)   Name of the patient: Hannah Noble  [id number]  [birth year]    Reason for referral-iron deficiency anemia   Referring physician-Dr. Bonna Gains  Date of visit: 03/23/20   History of presenting illness- Patient is a 49 year old female with a past medical history significant for asthma who was recently seen by GI for anemia. Most recent CBC from 01/13/2020 showed white count of 6.9, H&H of 11.3/33.6 with an MCV of 87.3 and a platelet count of 287. Iron study showed a low ferritin of 13 with a normal TIBC and iron saturation of 16%. She is currently undergoing GI work-up with Dr. Bonna Gains and has been referred to me for iron deficiency anemia  Patient reports having menstrual cycles that last for about 4 to 5 days for the first 2 days can be heavy.  She has not tried any oral iron.  Denies any consistent use of NSAIDs.  Denies any blood in her stools or melanotic stools.  Denies any family history of colon cancer  ECOG PS- 0  Pain scale- 0   Review of systems- Review of Systems  Constitutional: Positive for malaise/fatigue. Negative for chills, fever and weight loss.  HENT: Negative for congestion, ear discharge and nosebleeds.   Eyes: Negative for blurred vision.  Respiratory: Negative for cough, hemoptysis, sputum production, shortness of breath and wheezing.   Cardiovascular: Negative for chest pain, palpitations, orthopnea and claudication.  Gastrointestinal: Negative for abdominal pain, blood in stool, constipation, diarrhea, heartburn, melena, nausea and vomiting.  Genitourinary: Negative for dysuria, flank pain, frequency, hematuria and urgency.  Musculoskeletal: Negative for back pain, joint pain and myalgias.  Skin: Negative for rash.  Neurological: Negative for dizziness, tingling,  focal weakness, seizures, weakness and headaches.  Endo/Heme/Allergies: Does not bruise/bleed easily.  Psychiatric/Behavioral: Negative for depression and suicidal ideas. The patient does not have insomnia.     No Known Allergies  Patient Active Problem List   Diagnosis Date Noted  . Candidal intertrigo 10/28/2018  . Tinea corporis 10/14/2018  . Folliculitis 52/77/8242  . Screening for breast cancer 09/30/2014  . Routine general medical examination at a health care facility 07/15/2013  . Obesity (BMI 30-39.9) 07/15/2013  . Asthma in adult 11/21/2011  . Cervical dysplasia 11/21/2011     Past Medical History:  Diagnosis Date  . Asthma      Past Surgical History:  Procedure Laterality Date  . BREAST BIOPSY Right 2010   needle, with Sankar  . ESSURE TUBAL LIGATION  2012   Dr Rayford Halsted  . VAGINAL DELIVERY     X2    Social History   Socioeconomic History  . Marital status: Divorced    Spouse name: Not on file  . Number of children: 2  . Years of education: Not on file  . Highest education level: Not on file  Occupational History    Employer: [employer]  Tobacco Use  . Smoking status: Former Smoker    Packs/day: 0.50    Types: Cigarettes    Quit date: 09/25/2012    Years since quitting: 7.4  . Smokeless tobacco: Never Used  Substance and Sexual Activity  . Alcohol use: Yes    Comment: Rarely  . Drug use: No  . Sexual activity: Not on file  Other Topics Concern  . Not on file  Social History Narrative   Lives in  [place] with children 15YO and 20YO. 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       Regular Exercise -  Yes, walk 1 to 3 times a week 30 min   Daily Caffeine Use:  Diet pepsi 1-2         Social Determinants of Health   Financial Resource Strain:   . Difficulty of Paying Living Expenses:   Food Insecurity:   . Worried About Charity fundraiser in the Last Year:   . Arboriculturist in the Last Year:   Transportation Needs:   . Lexicographer (Medical):   Marland Kitchen Lack of Transportation (Non-Medical):   Physical Activity:   . Days of Exercise per Week:   . Minutes of Exercise per Session:   Stress:   . Feeling of Stress :   Social Connections:   . Frequency of Communication with Friends and Family:   . Frequency of Social Gatherings with Friends and Family:   . Attends Religious Services:   . Active Member of Clubs or Organizations:   . Attends Archivist Meetings:   Marland Kitchen Marital Status:   Intimate Partner Violence:   . Fear of Current or Ex-Partner:   . Emotionally Abused:   Marland Kitchen Physically Abused:   . Sexually Abused:      Family History  Problem Relation Age of Onset  . COPD Father   . Cancer Maternal Grandfather        melanoma  . Cancer Paternal Grandmother        lung  . Breast cancer Paternal Grandmother 87  . Colon cancer Neg Hx      Current Outpatient Medications:  .  ADVAIR DISKUS 250-50 MCG/DOSE AEPB, TAKE 1 PUFF BY MOUTH TWICE A DAY, Disp: 180 each, Rfl: 0 .  albuterol (VENTOLIN HFA) 108 (90 Base) MCG/ACT inhaler, TAKE TWO PUFFS BY MOUTH EVERY 6 HOURS AS NEEDED FOR WHEEZING AND SHORTNESS OF BREATH., Disp: 18 g, Rfl: 1 .  Clotrimazole 1 % OINT, Apply 1 application topically 2 (two) times daily. To the groin area for yeast infection (Patient not taking: Reported on 03/10/2020), Disp: 56.7 g, Rfl: 1 .  doxycycline (VIBRA-TABS) 100 MG tablet, Take 1 tablet (100 mg total) by mouth 2 (two) times daily. (Patient not taking: Reported on 03/10/2020), Disp: 10 tablet, Rfl: 0 .  mupirocin ointment (BACTROBAN) 2 %, APPLY 1 APPLICATION TOPICALLY 2 TIMES DAILY AS NEEDED. BETWEEN BREASTS, UNDER ARM PITS AS NEEDED. (Patient not taking: Reported on 03/10/2020), Disp: 22 g, Rfl: 0   Physical exam:  Vitals:   03/23/20 1521  BP: 122/82  Pulse: 80  Resp: 18  Temp: 98.2 F (36.8 C)  TempSrc: Oral  SpO2: 98%  Weight: 234 lb 12.8 oz (106.5 kg)   Physical Exam Constitutional:      General: She is not in  acute distress. Cardiovascular:     Rate and Rhythm: Normal rate and regular rhythm.     Heart sounds: Normal heart sounds.  Pulmonary:     Effort: Pulmonary effort is normal.     Breath sounds: Normal breath sounds.  Abdominal:     General: Bowel sounds are normal.     Palpations: Abdomen is soft.  Skin:    General: Skin is warm and dry.  Neurological:     Mental Status: She is alert and oriented to person, place, and time.        CMP Latest Ref Rng & Units 11/18/2019  Glucose 70 - 99 mg/dL 95  BUN 6 - 23 mg/dL 11  Creatinine 0.40 - 1.20 mg/dL 0.72  Sodium 135 - 145 mEq/L 137  Potassium 3.5 - 5.1 mEq/L 3.9  Chloride 96 - 112 mEq/L 105  CO2 19 - 32 mEq/L 26  Calcium 8.4 - 10.5 mg/dL 8.6  Total Protein 6.0 - 8.3 g/dL 6.8  Total Bilirubin 0.2 - 1.2 mg/dL 0.4  Alkaline Phos 39 - 117 U/L 34(L)  AST 0 - 37 U/L 13  ALT 0 - 35 U/L 13   CBC Latest Ref Rng & Units 01/13/2020  WBC 4.0 - 10.5 K/uL 6.9  Hemoglobin 12.0 - 15.0 g/dL 11.3(L)  Hematocrit 36 - 46 % 33.6(L)  Platelets 150 - 400 K/uL 287.0     Assessment and plan- Patient is a 49 y.o. female referred for iron deficiency anemia  Patient has mild normocytic anemia with a hemoglobin of 11. Her hemoglobin previously has been around 12 until January 2020.Ferritin levels are low at 13 indicating possible iron deficiency. TSH in February 2021 was normal. Her anemia is presently mild and I would therefore recommend taking oral iron instead of IV iron. If there is no improvement in her hemoglobin despite oral iron infusion can be considered.  I will see her back in about 2-1/2 months with repeat CBC ferritin and iron studies B12 and folate  Etiology of iron deficiency: GI work-up ongoing and will be getting endoscopy and colonoscopy this week.  Heavy menstrual cycles for the first 2 days may also be contributory.   Thank you for this kind referral and the opportunity to participate in the care of this patient   Visit  Diagnosis 1. Iron deficiency anemia, unspecified iron deficiency anemia type     Dr. Randa Evens, MD, MPH [facility] [phone] 03/23/2020 3:47 PM

## 2020-03-23 NOTE — Progress Notes (Signed)
Patient here for initial oncology appointment, expresses concerns of occasional shortness of breath, otherwise no other concerns.

## 2020-03-24 ENCOUNTER — Encounter: Payer: Self-pay | Admitting: Gastroenterology

## 2020-03-25 ENCOUNTER — Other Ambulatory Visit: Payer: Self-pay

## 2020-03-25 ENCOUNTER — Encounter
Admission: RE | Disposition: A | Discharge status: Home / Self Care | Payer: Self-pay | Source: Home / Self Care | Attending: Gastroenterology

## 2020-03-25 ENCOUNTER — Ambulatory Visit: Payer: BC Managed Care – PPO | Admitting: Registered Nurse

## 2020-03-25 ENCOUNTER — Encounter: Payer: Self-pay | Admitting: Gastroenterology

## 2020-03-25 ENCOUNTER — Ambulatory Visit
Admission: RE | Admit: 2020-03-25 | Discharge: 2020-03-25 | Disposition: A | Discharge status: Home / Self Care | Payer: BC Managed Care – PPO | Attending: Gastroenterology | Admitting: Gastroenterology

## 2020-03-25 DIAGNOSIS — Z1211 Encounter for screening for malignant neoplasm of colon: Secondary | ICD-10-CM | POA: Diagnosis not present

## 2020-03-25 DIAGNOSIS — J45909 Unspecified asthma, uncomplicated: Secondary | ICD-10-CM | POA: Insufficient documentation

## 2020-03-25 DIAGNOSIS — Z87891 Personal history of nicotine dependence: Secondary | ICD-10-CM | POA: Insufficient documentation

## 2020-03-25 DIAGNOSIS — Z7951 Long term (current) use of inhaled steroids: Secondary | ICD-10-CM | POA: Insufficient documentation

## 2020-03-25 DIAGNOSIS — D509 Iron deficiency anemia, unspecified: Secondary | ICD-10-CM

## 2020-03-25 DIAGNOSIS — M1990 Unspecified osteoarthritis, unspecified site: Secondary | ICD-10-CM | POA: Diagnosis not present

## 2020-03-25 DIAGNOSIS — K228 Other specified diseases of esophagus: Secondary | ICD-10-CM | POA: Diagnosis not present

## 2020-03-25 DIAGNOSIS — K648 Other hemorrhoids: Secondary | ICD-10-CM | POA: Insufficient documentation

## 2020-03-25 HISTORY — PX: COLONOSCOPY WITH PROPOFOL: SHX5780

## 2020-03-25 HISTORY — PX: ESOPHAGOGASTRODUODENOSCOPY (EGD) WITH PROPOFOL: SHX5813

## 2020-03-25 LAB — POCT PREGNANCY, URINE: Preg Test, Ur: NEGATIVE

## 2020-03-25 LAB — KOH PREP

## 2020-03-25 SURGERY — COLONOSCOPY WITH PROPOFOL
Anesthesia: General

## 2020-03-25 SURGERY — ESOPHAGOGASTRODUODENOSCOPY (EGD) WITH PROPOFOL
Anesthesia: General

## 2020-03-25 MED ORDER — SODIUM CHLORIDE 0.9 % IV SOLN: INTRAVENOUS | Status: DC

## 2020-03-25 MED ORDER — LIDOCAINE HCL (PF) 2 % IJ SOLN
INTRAMUSCULAR | Status: AC
  Filled 2020-03-25: qty 40

## 2020-03-25 MED ORDER — GLYCOPYRROLATE 0.2 MG/ML IJ SOLN
INTRAMUSCULAR | Status: DC | PRN
  Administered 2020-03-25: .2 mg via INTRAVENOUS

## 2020-03-25 MED ORDER — PROPOFOL 10 MG/ML IV BOLUS
INTRAVENOUS | Status: DC | PRN
  Administered 2020-03-25: 40 mg via INTRAVENOUS
  Administered 2020-03-25: 100 mg via INTRAVENOUS
  Administered 2020-03-25 (×3): 30 mg via INTRAVENOUS

## 2020-03-25 MED ORDER — PROPOFOL 500 MG/50ML IV EMUL
INTRAVENOUS | Status: AC
  Filled 2020-03-25: qty 100

## 2020-03-25 MED ORDER — LIDOCAINE HCL (CARDIAC) PF 100 MG/5ML IV SOSY
PREFILLED_SYRINGE | INTRAVENOUS | Status: DC | PRN
  Administered 2020-03-25: 100 mg via INTRAVENOUS

## 2020-03-25 MED ORDER — PROPOFOL 500 MG/50ML IV EMUL
INTRAVENOUS | Status: DC | PRN
  Administered 2020-03-25: 150 ug/kg/min via INTRAVENOUS

## 2020-03-25 MED ORDER — PHENYLEPHRINE HCL (PRESSORS) 10 MG/ML IV SOLN
INTRAVENOUS | Status: DC | PRN
  Administered 2020-03-25 (×2): 100 ug via INTRAVENOUS

## 2020-03-25 MED ORDER — DEXMEDETOMIDINE HCL 200 MCG/2ML IV SOLN
INTRAVENOUS | Status: DC | PRN
  Administered 2020-03-25: 12 ug via INTRAVENOUS

## 2020-03-25 MED ORDER — GLYCOPYRROLATE 0.2 MG/ML IJ SOLN
INTRAMUSCULAR | Status: AC
  Filled 2020-03-25: qty 1

## 2020-03-25 NOTE — Op Note (Signed)
[facility] Gastroenterology Patient Name: Hannah Noble Procedure Date: 03/25/2020 9:37 AM MRN: [id number] Account #: [id number] Date of Birth: 11-Oct-1970 Admit Type: Outpatient Age: 49 Room: [facility] ENDO ROOM 4 Gender: Female Note Status: Finalized Procedure:             Upper GI endoscopy Indications:           Iron deficiency anemia Providers:             Calypso Hagarty B. Bonna Gains MD, MD Referring MD:          Yvetta Coder. Arnett (Referring MD) Medicines:             Monitored Anesthesia Care Complications:         No immediate complications. Procedure:             Pre-Anesthesia Assessment:                        - Prior to the procedure, a History and Physical was                         performed, and patient medications, allergies and                         sensitivities were reviewed. The patient's tolerance                         of previous anesthesia was reviewed.                        - The risks and benefits of the procedure and the                         sedation options and risks were discussed with the                         patient. All questions were answered and informed                         consent was obtained.                        - Patient identification and proposed procedure were                         verified prior to the procedure by the physician, the                         nurse, the anesthesiologist, the anesthetist and the                         technician. The procedure was verified in the                         procedure room.                        - ASA Grade Assessment: II - A patient with mild                         systemic disease.  After obtaining informed consent, the endoscope was                         passed under direct vision. Throughout the procedure,                         the patient's blood pressure, pulse, and oxygen                         saturations were monitored continuously.  The Endoscope                         was introduced through the mouth, and advanced to the                         second part of duodenum. The upper GI endoscopy was                         accomplished with ease. The patient tolerated the                         procedure well. Findings:      White nummular lesions were noted in the mid esophagus and in the distal       esophagus. Brushings for KOH prep were obtained.      The exam of the esophagus was otherwise normal.      The entire examined stomach was normal.      The duodenal bulb, second portion of the duodenum and examined duodenum       were normal. Biopsies for histology were taken with a cold forceps for       evaluation of celiac disease. Impression:            - White nummular lesions in esophageal mucosa.                         Brushings performed.                        - Normal stomach.                        - Normal duodenal bulb, second portion of the duodenum                         and examined duodenum. Biopsied. Recommendation:        - Await pathology results.                        - Discharge patient to home (with escort).                        - Advance diet as tolerated.                        - Continue present medications.                        - Patient has a contact number available for  emergencies. The signs and symptoms of potential                         delayed complications were discussed with the patient.                         Return to normal activities tomorrow. Written                         discharge instructions were provided to the patient.                        - Discharge patient to home (with escort).                        - The findings and recommendations were discussed with                         the patient.                        - The findings and recommendations were discussed with                         the patient's family. Procedure Code(s):      --- Professional ---                        [phone], Esophagogastroduodenoscopy, flexible,                         transoral; with biopsy, single or multiple Diagnosis Code(s):     --- Professional ---                        K22.8, Other specified diseases of esophagus                        D50.9, Iron deficiency anemia, unspecified CPT copyright 2019 American Medical Association. All rights reserved. The codes documented in this report are preliminary and upon coder review may  be revised to meet current compliance requirements.  Vonda Antigua, MD Margretta Sidle B. Bonna Gains MD, MD 03/25/2020 10:01:09 AM This report has been signed electronically. Number of Addenda: 0 Note Initiated On: 03/25/2020 9:37 AM Estimated Blood Loss:  Estimated blood loss: none.      [facility]

## 2020-03-25 NOTE — Op Note (Signed)
[facility] Gastroenterology Patient Name: Hannah Noble Procedure Date: 03/25/2020 9:35 AM MRN: [id number] Account #: [id number] Date of Birth: [birth year] Admit Type: Outpatient Age: 49 Room: [facility] ENDO ROOM 4 Gender: Female Note Status: Finalized Procedure:             Colonoscopy Indications:           Screening for colorectal malignant neoplasm Providers:             Anokhi Shannon B. Bonna Gains MD, MD Referring MD:          Yvetta Coder. Arnett (Referring MD) Medicines:             Monitored Anesthesia Care Complications:         No immediate complications. Procedure:             Pre-Anesthesia Assessment:                        - Prior to the procedure, a History and Physical was                         performed, and patient medications, allergies and                         sensitivities were reviewed. The patient's tolerance                         of previous anesthesia was reviewed.                        - The risks and benefits of the procedure and the                         sedation options and risks were discussed with the                         patient. All questions were answered and informed                         consent was obtained.                        - Patient identification and proposed procedure were                         verified prior to the procedure by the physician, the                         nurse, the anesthetist and the technician. The                         procedure was verified in the pre-procedure area in                         the procedure room in the endoscopy suite.                        - ASA Grade Assessment: II - A patient with mild  systemic disease.                        - After reviewing the risks and benefits, the patient                         was deemed in satisfactory condition to undergo the                         procedure.                        After obtaining informed consent, the  colonoscope was                         passed under direct vision. Throughout the procedure,                         the patient's blood pressure, pulse, and oxygen                         saturations were monitored continuously. The                         Colonoscope was introduced through the anus and                         advanced to the the cecum, identified by appendiceal                         orifice and ileocecal valve. The colonoscopy was                         performed with ease. The patient tolerated the                         procedure well. The quality of the bowel preparation                         was fair. Findings:      The perianal and digital rectal examinations were normal.      The rectum, sigmoid colon, descending colon, transverse colon, ascending       colon and cecum appeared normal.      Non-bleeding internal hemorrhoids were found during retroflexion. Impression:            - Preparation of the colon was fair.                        - The rectum, sigmoid colon, descending colon,                         transverse colon, ascending colon and cecum are normal.                        - Non-bleeding internal hemorrhoids.                        - No specimens collected. Recommendation:        - To visualize the small bowel, perform video capsule  endoscopy at appointment to be scheduled.                        - Discharge patient to home.                        - Resume previous diet.                        - Continue present medications.                        - Repeat colonoscopy in 1 year, with 2 day prep.                        - Return to primary care physician as previously                         scheduled.                        - The findings and recommendations were discussed with                         the patient.                        - The findings and recommendations were discussed with                         the  patient's family.                        - Return to my office in 4 weeks.                        - High fiber diet. Procedure Code(s):     --- Professional ---                        [phone], Colonoscopy, flexible; diagnostic, including                         collection of specimen(s) by brushing or washing, when                         performed (separate procedure) Diagnosis Code(s):     --- Professional ---                        Z12.11, Encounter for screening for malignant neoplasm                         of colon CPT copyright 2019 American Medical Association. All rights reserved. The codes documented in this report are preliminary and upon coder review may  be revised to meet current compliance requirements.  Vonda Antigua, MD Margretta Sidle B. Bonna Gains MD, MD 03/25/2020 10:23:56 AM This report has been signed electronically. Number of Addenda: 0 Note Initiated On: 03/25/2020 9:35 AM Scope Withdrawal Time: 0 hours 8 minutes 57 seconds  Total Procedure Duration: 0 hours 13 minutes 30 seconds  Estimated Blood Loss:  Estimated blood loss: none.  [facility]

## 2020-03-25 NOTE — Anesthesia Postprocedure Evaluation (Signed)
Anesthesia Post Note  Patient: ERNEST ORR  Procedure(s) Performed: COLONOSCOPY WITH PROPOFOL (N/A ) ESOPHAGOGASTRODUODENOSCOPY (EGD) WITH PROPOFOL (N/A )  Patient location during evaluation: PACU Anesthesia Type: General Level of consciousness: awake and alert Pain management: pain level controlled Vital Signs Assessment: post-procedure vital signs reviewed and stable Respiratory status: spontaneous breathing, nonlabored ventilation, respiratory function stable and patient connected to nasal cannula oxygen Cardiovascular status: blood pressure returned to baseline and stable Postop Assessment: no apparent nausea or vomiting Anesthetic complications: no   No complications documented.   Last Vitals:  Vitals:   03/25/20 0923 03/25/20 1025  BP: 131/72 (!) 99/55  Pulse: 87 71  Resp: 18 17  Temp: (!) 36.3 C (!) 36.3 C  SpO2: 100% 99%    Last Pain:  Vitals:   03/25/20 1025  TempSrc: Temporal  PainSc: [place] Harika Laidlaw

## 2020-03-25 NOTE — Transfer of Care (Signed)
Immediate Anesthesia Transfer of Care Note  Patient: Hannah Noble  Procedure(s) Performed: COLONOSCOPY WITH PROPOFOL (N/A ) ESOPHAGOGASTRODUODENOSCOPY (EGD) WITH PROPOFOL (N/A )  Patient Location: Endoscopy Unit  Anesthesia Type:General  Level of Consciousness: drowsy  Airway & Oxygen Therapy: Patient Spontanous Breathing  Post-op Assessment: Report given to RN and Post -op Vital signs reviewed and stable  Post vital signs: Reviewed and stable  Last Vitals:  Vitals Value Taken Time  BP 99/55   Temp    Pulse 75 03/25/20 1026  Resp 10 03/25/20 1026  SpO2 99 % 03/25/20 1026  Vitals shown include unvalidated device data.  Last Pain:  Vitals:   03/25/20 0923  TempSrc: Temporal  PainSc: 0-No pain         Complications: No complications documented.

## 2020-03-25 NOTE — Anesthesia Preprocedure Evaluation (Signed)
Anesthesia Evaluation  Patient identified by MRN, date of birth, ID band Patient awake    Reviewed: Allergy & Precautions, H&P , NPO status , Patient's Chart, lab work & pertinent test results, reviewed documented beta blocker date and time   Airway Mallampati: II   Neck ROM: full    Dental  (+) Poor Dentition   Pulmonary asthma , former smoker,    Pulmonary exam normal        Cardiovascular Exercise Tolerance: Good negative cardio ROS Normal cardiovascular exam Rhythm:regular Rate:Normal     Neuro/Psych negative neurological ROS  negative psych ROS   GI/Hepatic negative GI ROS, Neg liver ROS,   Endo/Other  negative endocrine ROS  Renal/GU negative Renal ROS  negative genitourinary   Musculoskeletal   Abdominal   Peds  Hematology  (+) Blood dyscrasia, anemia ,   Anesthesia Other Findings Past Medical History: No date: Arthritis No date: Asthma 03/23/2020: Iron deficiency anemia Past Surgical History: 2010: BREAST BIOPSY; Right     Comment:  needle, with Sankar 2012: ESSURE TUBAL LIGATION     Comment:  Dr Rayford Halsted No date: TUBAL LIGATION No date: VAGINAL DELIVERY     Comment:  X2   Reproductive/Obstetrics negative OB ROS                             Anesthesia Physical Anesthesia Plan  ASA: III  Anesthesia Plan: General   Post-op Pain Management:    Induction:   PONV Risk Score and Plan:   Airway Management Planned:   Additional Equipment:   Intra-op Plan:   Post-operative Plan:   Informed Consent: I have reviewed the patients History and Physical, chart, labs and discussed the procedure including the risks, benefits and alternatives for the proposed anesthesia with the patient or authorized representative who has indicated his/her understanding and acceptance.     Dental Advisory Given  Plan Discussed with: CRNA  Anesthesia Plan Comments:          Anesthesia Quick Evaluation

## 2020-03-25 NOTE — H&P (Signed)
Hannah Antigua, MD [address], [place], [place], [place], [address] [address], [place], [place], [place], [address] Phone: [phone]  Fax: [phone]  Primary Care Physician:  Burnard Hawthorne, FNP   Pre-Procedure History & Physical: HPI:  Hannah Noble is a 49 y.o. female is here for a colonoscopy and EGD.   Past Medical History:  Diagnosis Date  . Arthritis   . Asthma   . Iron deficiency anemia 03/23/2020    Past Surgical History:  Procedure Laterality Date  . BREAST BIOPSY Right 2010   needle, with Sankar  . ESSURE TUBAL LIGATION  2012   Dr Rayford Halsted  . TUBAL LIGATION    . VAGINAL DELIVERY     X2    Prior to Admission medications   Medication Sig Start Date End Date Taking? Authorizing Provider  ADVAIR DISKUS 250-50 MCG/DOSE AEPB TAKE 1 PUFF BY MOUTH TWICE A DAY 07/09/17  Yes Burnard Hawthorne, FNP  albuterol (VENTOLIN HFA) 108 (90 Base) MCG/ACT inhaler TAKE TWO PUFFS BY MOUTH EVERY 6 HOURS AS NEEDED FOR WHEEZING AND SHORTNESS OF BREATH. 02/18/20  Yes Burnard Hawthorne, FNP  Biotin 10 MG TABS Take by mouth.   Yes [provider]  cholecalciferol (VITAMIN D3) 25 MCG (1000 UNIT) tablet Take 1,000 Units by mouth daily.   Yes [provider]  Multiple Vitamin (MULTIVITAMIN) tablet Take 1 tablet by mouth daily.   Yes [provider]  Clotrimazole 1 % OINT Apply 1 application topically 2 (two) times daily. To the groin area for yeast infection Patient not taking: Reported on 03/10/2020 11/18/19   Burnard Hawthorne, FNP  doxycycline (VIBRA-TABS) 100 MG tablet Take 1 tablet (100 mg total) by mouth 2 (two) times daily. Patient not taking: Reported on 03/10/2020 11/18/19   Burnard Hawthorne, FNP  mupirocin ointment (BACTROBAN) 2 % APPLY 1 APPLICATION TOPICALLY 2 TIMES DAILY AS NEEDED. BETWEEN BREASTS, UNDER ARM PITS AS NEEDED. 02/02/20   Burnard Hawthorne, FNP  SUPREP BOWEL PREP KIT 17.5-3.13-1.6 GM/177ML SOLN SMARTSIG:[id number]  Milliliter(s) By Mouth As Directed 03/10/20   [provider]    Allergies as of 03/10/2020  . (No Known Allergies)    Family History  Problem Relation Age of Onset  . COPD Father   . Cancer Maternal Grandfather        melanoma  . Cancer Paternal Grandmother        lung  . Breast cancer Paternal Grandmother 38  . Colon cancer Neg Hx     Social History   Socioeconomic History  . Marital status: Divorced    Spouse name: Not on file  . Number of children: 2  . Years of education: Not on file  . Highest education level: Not on file  Occupational History    Employer: [employer]  Tobacco Use  . Smoking status: Former Smoker    Packs/day: 0.50    Types: Cigarettes    Quit date: 09/25/2012    Years since quitting: 7.5  . Smokeless tobacco: Never Used  Vaping Use  . Vaping Use: Every day  Substance and Sexual Activity  . Alcohol use: Yes    Comment: Rarely none last 24hrs  . Drug use: No  . Sexual activity: Not on file  Other Topics Concern  . Not on file  Social History Narrative   Lives in [place] with children 15YO and 20YO. 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       Regular Exercise -  Yes, walk 1 to 3 times a week 30 min   Daily Caffeine Use:  Diet pepsi 1-2         Social Determinants of Health   Financial Resource Strain:   . Difficulty of Paying Living Expenses:   Food Insecurity:   . Worried About Charity fundraiser in the Last Year:   . Arboriculturist in the Last Year:   Transportation Needs:   . Film/video editor (Medical):   Marland Kitchen Lack of Transportation (Non-Medical):   Physical Activity:   . Days of Exercise per Week:   . Minutes of Exercise per Session:   Stress:   . Feeling of Stress :   Social Connections:   . Frequency of Communication with Friends and Family:   . Frequency of Social Gatherings with Friends and Family:   . Attends Religious Services:   . Active Member of Clubs or Organizations:   . Attends Theatre manager Meetings:   Marland Kitchen Marital Status:   Intimate Partner Violence:   . Fear of Current or Ex-Partner:   . Emotionally Abused:   Marland Kitchen Physically Abused:   . Sexually Abused:     Review of Systems: See HPI, otherwise negative ROS  Physical Exam: LMP 03/21/2020  General:   Alert,  pleasant and cooperative in NAD Head:  Normocephalic and atraumatic. Neck:  Supple; no masses or thyromegaly. Lungs:  Clear throughout to auscultation, normal respiratory effort.    Heart:  +S1, +S2, Regular rate and rhythm, No edema. Abdomen:  Soft, nontender and nondistended. Normal bowel sounds, without guarding, and without rebound.   Neurologic:  Alert and  oriented x4;  grossly normal neurologically.  Impression/Plan: Hannah Noble is here for a colonoscopy to be performed for average risk screening and EGD for iron def anemia  Risks, benefits, limitations, and alternatives regarding the procedures have been reviewed with the patient.  Questions have been answered.  All parties agreeable.   Virgel Manifold, MD  03/25/2020, 9:23 AM

## 2020-03-26 ENCOUNTER — Encounter: Payer: Self-pay | Admitting: Gastroenterology

## 2020-03-26 LAB — SURGICAL PATHOLOGY

## 2020-04-01 ENCOUNTER — Other Ambulatory Visit: Payer: Self-pay | Admitting: Gastroenterology

## 2020-04-01 MED ORDER — FLUCONAZOLE 200 MG PO TABS: ORAL_TABLET | ORAL | 0 refills | Status: AC

## 2020-04-02 ENCOUNTER — Other Ambulatory Visit: Payer: Self-pay | Admitting: Family

## 2020-04-02 DIAGNOSIS — J4520 Mild intermittent asthma, uncomplicated: Secondary | ICD-10-CM

## 2020-04-03 ENCOUNTER — Other Ambulatory Visit: Payer: Self-pay | Admitting: Family

## 2020-04-03 DIAGNOSIS — L739 Follicular disorder, unspecified: Secondary | ICD-10-CM

## 2020-04-05 ENCOUNTER — Telehealth: Payer: Self-pay | Admitting: Gastroenterology

## 2020-04-05 ENCOUNTER — Telehealth: Payer: Self-pay

## 2020-04-05 NOTE — Telephone Encounter (Signed)
LVM  2x for patient to call & schedule appt

## 2020-04-05 NOTE — Telephone Encounter (Signed)
Called patient but had to leave her a detailed message of what Dr. Bonna Gains recommended for patient. Please read below.

## 2020-04-05 NOTE — Telephone Encounter (Signed)
-----   Message from Virgel Manifold, MD sent at 04/01/2020 11:50 AM EDT ----- Hannah Noble please let the patient know, the brushings from her esophagus showed yeast.  I have sent a medication over to her pharmacy.  Please ensure patient has clinical follow-up in 6 to 8 weeks

## 2020-04-05 NOTE — Telephone Encounter (Signed)
LVM for patient to call the office to schedule 6-8 wk follow up per [facility]

## 2020-04-06 ENCOUNTER — Ambulatory Visit
Admission: RE | Admit: 2020-04-06 | Discharge: 2020-04-06 | Disposition: A | Discharge status: Home / Self Care | Payer: BC Managed Care – PPO | Source: Ambulatory Visit | Attending: Family | Admitting: Family

## 2020-04-06 DIAGNOSIS — Z1231 Encounter for screening mammogram for malignant neoplasm of breast: Secondary | ICD-10-CM | POA: Insufficient documentation

## 2020-04-06 DIAGNOSIS — Z0000 Encounter for general adult medical examination without abnormal findings: Secondary | ICD-10-CM

## 2020-05-14 ENCOUNTER — Telehealth: Payer: Self-pay | Admitting: Family

## 2020-05-14 DIAGNOSIS — J4520 Mild intermittent asthma, uncomplicated: Secondary | ICD-10-CM

## 2020-05-14 MED ORDER — ALBUTEROL SULFATE HFA 108 (90 BASE) MCG/ACT IN AERS: 2.0000 | INHALATION_SPRAY | RESPIRATORY_TRACT | 1 refills | Status: DC | PRN

## 2020-05-14 NOTE — Telephone Encounter (Signed)
Patient called in ned refill onalbuterol (VENTOLIN HFA) 108 (90 Base) MCG/ACT inhaler

## 2020-06-01 ENCOUNTER — Inpatient Hospital Stay: Payer: BC Managed Care – PPO | Attending: Oncology

## 2020-06-01 ENCOUNTER — Other Ambulatory Visit: Payer: Self-pay

## 2020-06-01 DIAGNOSIS — D509 Iron deficiency anemia, unspecified: Secondary | ICD-10-CM | POA: Diagnosis not present

## 2020-06-01 DIAGNOSIS — Z79899 Other long term (current) drug therapy: Secondary | ICD-10-CM | POA: Diagnosis not present

## 2020-06-01 DIAGNOSIS — E538 Deficiency of other specified B group vitamins: Secondary | ICD-10-CM | POA: Diagnosis not present

## 2020-06-01 LAB — CBC WITH DIFFERENTIAL/PLATELET
Abs Immature Granulocytes: 0.01 10*3/uL (ref 0.00–0.07)
Basophils Absolute: 0 10*3/uL (ref 0.0–0.1)
Basophils Relative: 1 %
Eosinophils Absolute: 0.2 10*3/uL (ref 0.0–0.5)
Eosinophils Relative: 3 %
HCT: 33.3 % — ABNORMAL LOW (ref 36.0–46.0)
Hemoglobin: 11.4 g/dL — ABNORMAL LOW (ref 12.0–15.0)
Immature Granulocytes: 0 %
Lymphocytes Relative: 25 %
Lymphs Abs: 1.5 10*3/uL (ref 0.7–4.0)
MCH: 29.3 pg (ref 26.0–34.0)
MCHC: 34.2 g/dL (ref 30.0–36.0)
MCV: 85.6 fL (ref 80.0–100.0)
Monocytes Absolute: 0.4 10*3/uL (ref 0.1–1.0)
Monocytes Relative: 7 %
Neutro Abs: 3.8 10*3/uL (ref 1.7–7.7)
Neutrophils Relative %: 64 %
Platelets: 266 10*3/uL (ref 150–400)
RBC: 3.89 MIL/uL (ref 3.87–5.11)
RDW: 14 % (ref 11.5–15.5)
WBC: 6 10*3/uL (ref 4.0–10.5)
nRBC: 0 % (ref 0.0–0.2)

## 2020-06-01 LAB — IRON AND TIBC
Iron: 40 ug/dL (ref 28–170)
Saturation Ratios: 10 % — ABNORMAL LOW (ref 10.4–31.8)
TIBC: 417 ug/dL (ref 250–450)
UIBC: 377 ug/dL

## 2020-06-01 LAB — VITAMIN B12: Vitamin B-12: 231 pg/mL (ref 180–914)

## 2020-06-01 LAB — FOLATE: Folate: 40 ng/mL (ref >5.9)

## 2020-06-01 LAB — FERRITIN: Ferritin: 7 ng/mL — ABNORMAL LOW (ref 11–307)

## 2020-06-02 ENCOUNTER — Other Ambulatory Visit: Payer: Self-pay

## 2020-06-02 ENCOUNTER — Ambulatory Visit (INDEPENDENT_AMBULATORY_CARE_PROVIDER_SITE_OTHER): Payer: BC Managed Care – PPO | Admitting: Gastroenterology

## 2020-06-02 ENCOUNTER — Encounter: Payer: Self-pay | Admitting: Gastroenterology

## 2020-06-02 VITALS — BP 154/84 | HR 86 | Temp 98.2°F | Ht 69.0 in | Wt 230.0 lb

## 2020-06-02 DIAGNOSIS — D509 Iron deficiency anemia, unspecified: Secondary | ICD-10-CM | POA: Diagnosis not present

## 2020-06-03 ENCOUNTER — Inpatient Hospital Stay (HOSPITAL_BASED_OUTPATIENT_CLINIC_OR_DEPARTMENT_OTHER): Payer: BC Managed Care – PPO | Admitting: Oncology

## 2020-06-03 ENCOUNTER — Encounter: Payer: Self-pay | Admitting: Oncology

## 2020-06-03 DIAGNOSIS — E538 Deficiency of other specified B group vitamins: Secondary | ICD-10-CM | POA: Diagnosis not present

## 2020-06-03 DIAGNOSIS — D509 Iron deficiency anemia, unspecified: Secondary | ICD-10-CM

## 2020-06-03 NOTE — Progress Notes (Signed)
Hannah Antigua, MD [address]  [place]  [place], [place] [address]  Main: [phone]  Fax: [phone]   Primary Care Physician: Burnard Hawthorne, FNP   Chief Complaint  Patient presents with  . IDA    HPI: Hannah Noble is a 49 y.o. female here for follow-up of iron deficiency anemia.  EGD and colonoscopy unrevealing for etiology of iron deficiency anemia.  The patient denies abdominal or flank pain, anorexia, nausea or vomiting, dysphagia, change in bowel habits or black or bloody stools or weight loss.   Current Outpatient Medications  Medication Sig Dispense Refill  . ADVAIR DISKUS 250-50 MCG/DOSE AEPB TAKE 1 PUFF BY MOUTH TWICE A DAY 180 each 0  . albuterol (VENTOLIN HFA) 108 (90 Base) MCG/ACT inhaler Inhale 2 puffs into the lungs every 4 (four) hours as needed for wheezing or shortness of breath. 18 g 1  . Biotin 10 MG TABS Take by mouth.    . cholecalciferol (VITAMIN D3) 25 MCG (1000 UNIT) tablet Take 1,000 Units by mouth daily.    . Multiple Vitamin (MULTIVITAMIN) tablet Take 1 tablet by mouth daily.     No current facility-administered medications for this visit.    Allergies as of 06/02/2020  . (No Known Allergies)    ROS:  General: Negative for anorexia, weight loss, fever, chills, fatigue, weakness. ENT: Negative for hoarseness, difficulty swallowing , nasal congestion. CV: Negative for chest pain, angina, palpitations, dyspnea on exertion, peripheral edema.  Respiratory: Negative for dyspnea at rest, dyspnea on exertion, cough, sputum, wheezing.  GI: See history of present illness. GU:  Negative for dysuria, hematuria, urinary incontinence, urinary frequency, nocturnal urination.  Endo: Negative for unusual weight change.    Physical Examination:   BP (!) 154/84   Pulse 86   Temp 98.2 F (36.8 C) (Oral)   Ht 5\' 9"  (1.753 m)   Wt 230 lb (104.3 kg)   BMI 33.97 kg/m   General: Well-nourished, well-developed in no acute  distress.  Eyes: No icterus. Conjunctivae pink. Mouth: Oropharyngeal mucosa moist and pink , no lesions erythema or exudate. Neck: Supple, Trachea midline Abdomen: Bowel sounds are normal, nontender, nondistended, no hepatosplenomegaly or masses, no abdominal bruits or hernia , no rebound or guarding.   Extremities: No lower extremity edema. No clubbing or deformities. Neuro: Alert and oriented x 3.  Grossly intact. Skin: Warm and dry, no jaundice.   Psych: Alert and cooperative, normal mood and affect.   Labs: CMP     Component Value Date/Time   NA 137 11/18/2019 0934   NA 136 (A) 06/25/2010 0744   K 3.9 11/18/2019 0934   CL 105 11/18/2019 0934   CO2 26 11/18/2019 0934   GLUCOSE 95 11/18/2019 0934   BUN 11 11/18/2019 0934   BUN 14 06/25/2010 0744   CREATININE 0.72 11/18/2019 0934   CALCIUM 8.6 11/18/2019 0934   PROT 6.8 11/18/2019 0934   ALBUMIN 4.0 11/18/2019 0934   AST 13 11/18/2019 0934   ALT 13 11/18/2019 0934   ALKPHOS 34 (L) 11/18/2019 0934   BILITOT 0.4 11/18/2019 0934   Lab Results  Component Value Date   WBC 6.0 06/01/2020   HGB 11.4 (L) 06/01/2020   HCT 33.3 (L) 06/01/2020   MCV 85.6 06/01/2020   PLT 266 06/01/2020    Imaging Studies: No results found.  Assessment and Plan:   Hannah Noble is a 49 y.o. y/o female here for follow-up of iron deficiency anemia  Small bowel capsule study indicated at this time  I have discussed alternative options, risks & benefits,  which include, but are not limited to, bleeding, infection, perforation, respiratory complication & drug reaction.  The patient agrees with this plan & written consent will be obtained.    In addition the above, the risks of small bowel capsule getting stuck in the GI tract were discussed. Need for surgery for removal of the capsule, if this occurs were discussed as well. This usually occurs if a stricture, mass or abnormality is present in the small bowel. Pt verbalized understanding and is  agreeable to proceed with small bowel capsule study.   Patient also had a fair prep on last exam and repeat colonoscopy recommended in a year with 2-day prep.  This was discussed with patient and she verbalized understanding.  Large lesions were ruled out  Dr Hannah Noble

## 2020-06-04 NOTE — Progress Notes (Signed)
I connected with Hannah Noble on 06/04/20 at  2:30 PM EDT by video enabled telemedicine visit and verified that I am speaking with the correct person using two identifiers.   I discussed the limitations, risks, security and privacy concerns of performing an evaluation and management service by telemedicine and the availability of in-person appointments. I also discussed with the patient that there may be a patient responsible charge related to this service. The patient expressed understanding and agreed to proceed.  Other persons participating in the visit and their role in the encounter:  none  Patient's location:  home Provider's location:  work  Risk analyst Complaint: Routine follow-up of iron deficiency anemia  History of present illness: Patient is a 49 year old female with a past medical history significant for asthma who was recently seen by GI for anemia. Most recent CBC from 01/13/2020 showed white count of 6.9, H&H of 11.3/33.6 with an MCV of 87.3 and a platelet count of 287. Iron study showed a low ferritin of 13 with a normal TIBC and iron saturation of 16%. She is currently undergoing GI work-up with Dr. Bonna Gains and has been referred to me for iron deficiency anemia  Blood work from June 2021 was consistent with iron deficiency without significant anemia. Patient opted for oral iron. She has not received any IV iron so far.  Interval history : Patient states that she takes oral iron but has not been taking it consistently. Denies any fatigue or other symptoms at this time and overall feels well   Review of Systems  Constitutional: Negative for chills, fever, malaise/fatigue and weight loss.  HENT: Negative for congestion, ear discharge and nosebleeds.   Eyes: Negative for blurred vision.  Respiratory: Negative for cough, hemoptysis, sputum production, shortness of breath and wheezing.   Cardiovascular: Negative for chest pain, palpitations, orthopnea and claudication.   Gastrointestinal: Negative for abdominal pain, blood in stool, constipation, diarrhea, heartburn, melena, nausea and vomiting.  Genitourinary: Negative for dysuria, flank pain, frequency, hematuria and urgency.  Musculoskeletal: Negative for back pain, joint pain and myalgias.  Skin: Negative for rash.  Neurological: Negative for dizziness, tingling, focal weakness, seizures, weakness and headaches.  Endo/Heme/Allergies: Does not bruise/bleed easily.  Psychiatric/Behavioral: Negative for depression and suicidal ideas. The patient does not have insomnia.     No Known Allergies  Past Medical History:  Diagnosis Date  . Arthritis   . Asthma   . Iron deficiency anemia 03/23/2020    Past Surgical History:  Procedure Laterality Date  . BREAST BIOPSY Right 2010   needle, with Sankar  . COLONOSCOPY WITH PROPOFOL N/A 03/25/2020   Procedure: COLONOSCOPY WITH PROPOFOL;  Surgeon: Virgel Manifold, MD;  Location: [facility] ENDOSCOPY;  Service: Endoscopy;  Laterality: N/A;  . ESOPHAGOGASTRODUODENOSCOPY (EGD) WITH PROPOFOL N/A 03/25/2020   Procedure: ESOPHAGOGASTRODUODENOSCOPY (EGD) WITH PROPOFOL;  Surgeon: Virgel Manifold, MD;  Location: [facility] ENDOSCOPY;  Service: Endoscopy;  Laterality: N/A;  . ESSURE TUBAL LIGATION  2012   Dr Rayford Halsted  . TUBAL LIGATION    . VAGINAL DELIVERY     X2    Social History   Socioeconomic History  . Marital status: Divorced    Spouse name: Not on file  . Number of children: 2  . Years of education: Not on file  . Highest education level: Not on file  Occupational History    Employer: [employer]  Tobacco Use  . Smoking status: Former Smoker    Packs/day: 0.50    Types: Cigarettes  Quit date: 09/25/2012    Years since quitting: 7.6  . Smokeless tobacco: Never Used  Vaping Use  . Vaping Use: Every day  Substance and Sexual Activity  . Alcohol use: Yes    Comment: Rarely none last 24hrs  . Drug use: No  . Sexual activity: Not on file  Other  Topics Concern  . Not on file  Social History Narrative   Lives in [place] with children 15YO and 20YO. 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       Regular Exercise -  Yes, walk 1 to 3 times a week 30 min   Daily Caffeine Use:  Diet pepsi 1-2         Social Determinants of Health   Financial Resource Strain:   . Difficulty of Paying Living Expenses: Not on file  Food Insecurity:   . Worried About Charity fundraiser in the Last Year: Not on file  . Ran Out of Food in the Last Year: Not on file  Transportation Needs:   . Lack of Transportation (Medical): Not on file  . Lack of Transportation (Non-Medical): Not on file  Physical Activity:   . Days of Exercise per Week: Not on file  . Minutes of Exercise per Session: Not on file  Stress:   . Feeling of Stress : Not on file  Social Connections:   . Frequency of Communication with Friends and Family: Not on file  . Frequency of Social Gatherings with Friends and Family: Not on file  . Attends Religious Services: Not on file  . Active Member of Clubs or Organizations: Not on file  . Attends Archivist Meetings: Not on file  . Marital Status: Not on file  Intimate Partner Violence:   . Fear of Current or Ex-Partner: Not on file  . Emotionally Abused: Not on file  . Physically Abused: Not on file  . Sexually Abused: Not on file    Family History  Problem Relation Age of Onset  . COPD Father   . Cancer Maternal Grandfather        melanoma  . Cancer Paternal Grandmother        lung  . Breast cancer Paternal Grandmother 101  . Colon cancer Neg Hx      Current Outpatient Medications:  .  ADVAIR DISKUS 250-50 MCG/DOSE AEPB, TAKE 1 PUFF BY MOUTH TWICE A DAY, Disp: 180 each, Rfl: 0 .  albuterol (VENTOLIN HFA) 108 (90 Base) MCG/ACT inhaler, Inhale 2 puffs into the lungs every 4 (four) hours as needed for wheezing or shortness of breath., Disp: 18 g, Rfl: 1 .  Biotin 10 MG TABS, Take by mouth., Disp: , Rfl:  .   cholecalciferol (VITAMIN D3) 25 MCG (1000 UNIT) tablet, Take 1,000 Units by mouth daily., Disp: , Rfl:  .  Multiple Vitamin (MULTIVITAMIN) tablet, Take 1 tablet by mouth daily., Disp: , Rfl:   No results found.  No images are attached to the encounter.   CMP Latest Ref Rng & Units 11/18/2019  Glucose 70 - 99 mg/dL 95  BUN 6 - 23 mg/dL 11  Creatinine 0.40 - 1.20 mg/dL 0.72  Sodium 135 - 145 mEq/L 137  Potassium 3.5 - 5.1 mEq/L 3.9  Chloride 96 - 112 mEq/L 105  CO2 19 - 32 mEq/L 26  Calcium 8.4 - 10.5 mg/dL 8.6  Total Protein 6.0 - 8.3 g/dL 6.8  Total Bilirubin 0.2 - 1.2 mg/dL 0.4  Alkaline Phos 39 - 117  U/L 34(L)  AST 0 - 37 U/L 13  ALT 0 - 35 U/L 13   CBC Latest Ref Rng & Units 06/01/2020  WBC 4.0 - 10.5 K/uL 6.0  Hemoglobin 12.0 - 15.0 g/dL 11.4(L)  Hematocrit 36 - 46 % 33.3(L)  Platelets 150 - 400 K/uL 266     Observation/objective: Appears in no acute distress over video visit today. Breathing is nonlabored  Assessment and plan: Patient is a 49 year old female with history of iron deficiency anemia  Patient's hemoglobin has remained stable around 11 over the last 6 months. She continues to be iron deficientAnd a ferritin levels are presently 7 with iron saturation of 10%. B12 levels are also mildly low at 231. We again discussed doing IV iron. However patient has no symptoms and would like to take iron more consistently every day along with B12 supplements. I think it would be reasonable given that she is not significantly anemic or symptomatic.  Follow-up instructions: CBC ferritin and iron studies in 4 months followed by video visit  I discussed the assessment and treatment plan with the patient. The patient was provided an opportunity to ask questions and all were answered. The patient agreed with the plan and demonstrated an understanding of the instructions.   The patient was advised to call back or seek an in-person evaluation if the symptoms worsen or if the condition  fails to improve as anticipated.   Visit Diagnosis: 1. Iron deficiency anemia, unspecified iron deficiency anemia type   2. B12 deficiency     Dr. Randa Evens, MD, MPH [facility] at [facility] Tel- [phone] 06/04/2020 12:56 PM

## 2020-06-05 ENCOUNTER — Other Ambulatory Visit: Payer: Self-pay | Admitting: Family

## 2020-06-05 DIAGNOSIS — J4520 Mild intermittent asthma, uncomplicated: Secondary | ICD-10-CM

## 2020-06-28 ENCOUNTER — Telehealth: Payer: Self-pay | Admitting: Gastroenterology

## 2020-06-28 ENCOUNTER — Other Ambulatory Visit: Payer: Self-pay | Admitting: Family

## 2020-06-28 DIAGNOSIS — J4520 Mild intermittent asthma, uncomplicated: Secondary | ICD-10-CM

## 2020-06-28 NOTE — Telephone Encounter (Signed)
Patient called and wants to scheduled procedure. Clinical staff was informed.

## 2020-06-28 NOTE — Telephone Encounter (Signed)
Patient left another vm to confirm procedure for tomorrow 10.5.21 had been canceled. Called pt back and got vm. Notified pt via vm that tomorrow's procedure was canceled and to let CMA Maritza know via mychart or phone call if Oct 12 would work for procedure to be rescheduled on.

## 2020-06-28 NOTE — Telephone Encounter (Signed)
Called patient back twice and left her a voicemail to call me back. However, I also sent her a MyChart message letting her know that our next available date for her to have her capsule study is on October 12 th, 2021 and to please let me know so I could place the order.

## 2020-06-28 NOTE — Telephone Encounter (Signed)
Patient has several questions about procedure with Dr. Bonna Gains for tomorrow 10.5.21. Please call pt back to discuss all questions. Pt had ov with Dr. Bonna Gains on 9.8.21

## 2020-06-28 NOTE — Telephone Encounter (Signed)
-----   Message from Storm Frisk, [place] sent at 06/28/2020  8:31 AM EDT ----- Regarding: Procedure questions Good Morning,  Patient called and left a voicemail regarding the procedure schedule for tomorrow with Dr. Darene Lamer. Can you reach out to patient [phone].     Thanks, Jovon

## 2020-06-28 NOTE — Telephone Encounter (Signed)
Called patient back to explain what she needed to do for her capsule study. Patient also wanted to reschedule it since she forgot to stop taking her iron supplements. I provided her with the number to call to reschedule. Patient had no further questions.

## 2020-06-29 ENCOUNTER — Encounter: Admission: RE | Payer: Self-pay | Source: Home / Self Care

## 2020-06-29 ENCOUNTER — Other Ambulatory Visit: Payer: Self-pay

## 2020-06-29 ENCOUNTER — Ambulatory Visit
Admission: RE | Admit: 2020-06-29 | Payer: BC Managed Care – PPO | Source: Home / Self Care | Admitting: Gastroenterology

## 2020-06-29 DIAGNOSIS — D509 Iron deficiency anemia, unspecified: Secondary | ICD-10-CM

## 2020-06-29 SURGERY — IMAGING PROCEDURE, GI TRACT, INTRALUMINAL, VIA CAPSULE

## 2020-06-29 NOTE — Telephone Encounter (Signed)
Called patient back and had to leave her another voicemail letting her know that I scheduled her capsule study for 07/06/2020 at [facility] and that I was going to send her instructions through [facility] with the new date. I also stated that if she had further questions to call us back.

## 2020-07-16 ENCOUNTER — Other Ambulatory Visit: Admission: RE | Admit: 2020-07-16 | Payer: BC Managed Care – PPO | Source: Ambulatory Visit

## 2020-07-16 ENCOUNTER — Telehealth: Payer: Self-pay | Admitting: Gastroenterology

## 2020-07-16 NOTE — Telephone Encounter (Signed)
Patient called to cancel procedure scheduled on 07/20/2020. And don't know when she will call back to reschedule per patient.

## 2020-07-16 NOTE — Telephone Encounter (Signed)
Patient called and cancelled her procedure for 07/20/2020 and stated that she was not sure when she would reschedule.

## 2020-07-20 ENCOUNTER — Ambulatory Visit
Admission: RE | Admit: 2020-07-20 | Discharge: 2020-07-20 | Disposition: A | Discharge status: Home / Self Care | Payer: BC Managed Care – PPO | Attending: Gastroenterology | Admitting: Gastroenterology

## 2020-07-20 ENCOUNTER — Encounter
Admission: RE | Disposition: A | Discharge status: Home / Self Care | Payer: Self-pay | Source: Home / Self Care | Attending: Gastroenterology

## 2020-07-20 ENCOUNTER — Ambulatory Visit
Admission: RE | Admit: 2020-07-20 | Payer: BC Managed Care – PPO | Source: Home / Self Care | Admitting: Gastroenterology

## 2020-07-20 ENCOUNTER — Encounter: Admission: RE | Payer: Self-pay | Source: Home / Self Care

## 2020-07-20 ENCOUNTER — Other Ambulatory Visit: Payer: Self-pay

## 2020-07-20 DIAGNOSIS — D509 Iron deficiency anemia, unspecified: Secondary | ICD-10-CM | POA: Insufficient documentation

## 2020-07-20 HISTORY — PX: GIVENS CAPSULE STUDY: SHX5432

## 2020-07-20 SURGERY — IMAGING PROCEDURE, GI TRACT, INTRALUMINAL, VIA CAPSULE

## 2020-07-21 ENCOUNTER — Encounter: Payer: Self-pay | Admitting: Gastroenterology

## 2020-08-10 ENCOUNTER — Encounter: Payer: Self-pay | Admitting: Gastroenterology

## 2020-08-11 ENCOUNTER — Other Ambulatory Visit: Payer: Self-pay | Admitting: Family

## 2020-08-11 DIAGNOSIS — J4520 Mild intermittent asthma, uncomplicated: Secondary | ICD-10-CM

## 2020-09-05 ENCOUNTER — Other Ambulatory Visit: Payer: Self-pay | Admitting: Family

## 2020-09-05 DIAGNOSIS — J4520 Mild intermittent asthma, uncomplicated: Secondary | ICD-10-CM

## 2020-09-06 ENCOUNTER — Encounter: Payer: Self-pay | Admitting: Family

## 2020-10-08 ENCOUNTER — Inpatient Hospital Stay: Payer: BC Managed Care – PPO

## 2020-10-08 ENCOUNTER — Inpatient Hospital Stay: Payer: BC Managed Care – PPO | Attending: Oncology

## 2020-10-08 DIAGNOSIS — D509 Iron deficiency anemia, unspecified: Secondary | ICD-10-CM | POA: Diagnosis not present

## 2020-10-08 DIAGNOSIS — N920 Excessive and frequent menstruation with regular cycle: Secondary | ICD-10-CM | POA: Insufficient documentation

## 2020-10-08 DIAGNOSIS — Z87891 Personal history of nicotine dependence: Secondary | ICD-10-CM | POA: Diagnosis not present

## 2020-10-08 DIAGNOSIS — J45909 Unspecified asthma, uncomplicated: Secondary | ICD-10-CM | POA: Diagnosis not present

## 2020-10-08 DIAGNOSIS — Z79899 Other long term (current) drug therapy: Secondary | ICD-10-CM | POA: Insufficient documentation

## 2020-10-08 DIAGNOSIS — Z7951 Long term (current) use of inhaled steroids: Secondary | ICD-10-CM | POA: Diagnosis not present

## 2020-10-08 DIAGNOSIS — M1990 Unspecified osteoarthritis, unspecified site: Secondary | ICD-10-CM | POA: Insufficient documentation

## 2020-10-08 LAB — CBC
HCT: 39.1 % (ref 36.0–46.0)
Hemoglobin: 13.8 g/dL (ref 12.0–15.0)
MCH: 31.1 pg (ref 26.0–34.0)
MCHC: 35.3 g/dL (ref 30.0–36.0)
MCV: 88.1 fL (ref 80.0–100.0)
Platelets: 277 10*3/uL (ref 150–400)
RBC: 4.44 MIL/uL (ref 3.87–5.11)
RDW: 12.4 % (ref 11.5–15.5)
WBC: 7.6 10*3/uL (ref 4.0–10.5)
nRBC: 0 % (ref 0.0–0.2)

## 2020-10-08 LAB — VITAMIN B12: Vitamin B-12: 1784 pg/mL — ABNORMAL HIGH (ref 180–914)

## 2020-10-08 LAB — IRON AND TIBC
Iron: 89 ug/dL (ref 28–170)
Saturation Ratios: 21 % (ref 10.4–31.8)
TIBC: 423 ug/dL (ref 250–450)
UIBC: 334 ug/dL

## 2020-10-08 LAB — FERRITIN: Ferritin: 28 ng/mL (ref 11–307)

## 2020-10-11 ENCOUNTER — Other Ambulatory Visit: Payer: Self-pay | Admitting: Family

## 2020-10-11 DIAGNOSIS — J4520 Mild intermittent asthma, uncomplicated: Secondary | ICD-10-CM

## 2020-10-12 ENCOUNTER — Other Ambulatory Visit: Payer: Self-pay

## 2020-10-12 ENCOUNTER — Inpatient Hospital Stay (HOSPITAL_BASED_OUTPATIENT_CLINIC_OR_DEPARTMENT_OTHER): Payer: BC Managed Care – PPO | Admitting: Oncology

## 2020-10-12 ENCOUNTER — Telehealth: Payer: Self-pay | Admitting: Oncology

## 2020-10-12 DIAGNOSIS — D509 Iron deficiency anemia, unspecified: Secondary | ICD-10-CM

## 2020-10-12 NOTE — Telephone Encounter (Signed)
Called patient to notify her of appts on 5/18 and 9/19 and 9/22. Availability confirmed.

## 2020-10-12 NOTE — Progress Notes (Signed)
I connected with Hannah Noble on 10/12/20 at  2:30 PM EST by video enabled telemedicine visit and verified that I am speaking with the correct person using two identifiers.   I discussed the limitations, risks, security and privacy concerns of performing an evaluation and management service by telemedicine and the availability of in-person appointments. I also discussed with the patient that there may be a patient responsible charge related to this service. The patient expressed understanding and agreed to proceed.  Other persons participating in the visit and their role in the encounter:  none  Patient's location:  home Provider's location:  work  Risk analyst Complaint: Routine follow-up of iron deficiency anemia possibly secondary to menorrhagia  History of present illness:  Patient is a 50 year old female with a past medical history significant for asthma who was recently seen by GI for anemia. Most recent CBC from 01/13/2020 showed white count of 6.9, H&H of 11.3/33.6 with an MCV of 87.3 and a platelet count of 287. Iron study showed a low ferritin of 13 with a normal TIBC and iron saturation of 16%.  GI work-up with Dr. Bonna Gains was negative for any bleeding.  Patient still gets menstrual cycles regularly and the first 2 or 3 days can be particularly heavy.  Blood work from June 2021 was consistent with iron deficiency without significant anemia. Patient opted for oral iron. She has not received any IV iron so far.   Interval history: Patient is tolerating oral iron well without any significant side effects.  Menstrual cycles can be heavy at times.  Denies any blood loss in her stool or urine   Review of Systems  Constitutional: Negative for chills, fever, malaise/fatigue and weight loss.  HENT: Negative for congestion, ear discharge and nosebleeds.   Eyes: Negative for blurred vision.  Respiratory: Negative for cough, hemoptysis, sputum production, shortness of breath and wheezing.    Cardiovascular: Negative for chest pain, palpitations, orthopnea and claudication.  Gastrointestinal: Negative for abdominal pain, blood in stool, constipation, diarrhea, heartburn, melena, nausea and vomiting.  Genitourinary: Negative for dysuria, flank pain, frequency, hematuria and urgency.  Musculoskeletal: Negative for back pain, joint pain and myalgias.  Skin: Negative for rash.  Neurological: Negative for dizziness, tingling, focal weakness, seizures, weakness and headaches.  Endo/Heme/Allergies: Does not bruise/bleed easily.  Psychiatric/Behavioral: Negative for depression and suicidal ideas. The patient does not have insomnia.     No Known Allergies  Past Medical History:  Diagnosis Date  . Arthritis   . Asthma   . Iron deficiency anemia 03/23/2020    Past Surgical History:  Procedure Laterality Date  . BREAST BIOPSY Right 2010   needle, with Sankar  . COLONOSCOPY WITH PROPOFOL N/A 03/25/2020   Procedure: COLONOSCOPY WITH PROPOFOL;  Surgeon: Virgel Manifold, MD;  Location: [facility];  Service: Endoscopy;  Laterality: N/A;  . ESOPHAGOGASTRODUODENOSCOPY (EGD) WITH PROPOFOL N/A 03/25/2020   Procedure: ESOPHAGOGASTRODUODENOSCOPY (EGD) WITH PROPOFOL;  Surgeon: Virgel Manifold, MD;  Location: [facility];  Service: Endoscopy;  Laterality: N/A;  . ESSURE TUBAL LIGATION  2012   Dr Rayford Halsted  . GIVENS CAPSULE STUDY N/A 07/20/2020   Procedure: GIVENS CAPSULE STUDY;  Surgeon: Virgel Manifold, MD;  Location: [facility];  Service: Endoscopy;  Laterality: N/A;  . TUBAL LIGATION    . VAGINAL DELIVERY     X2    Social History   Socioeconomic History  . Marital status: Divorced    Spouse name: Not on file  . Number of children: 2  .  Years of education: Not on file  . Highest education level: Not on file  Occupational History    Employer: [employer]  Tobacco Use  . Smoking status: Former Smoker    Packs/day: 0.50    Types: Cigarettes    Quit date:  09/25/2012    Years since quitting: 8.0  . Smokeless tobacco: Never Used  Vaping Use  . Vaping Use: Every day  Substance and Sexual Activity  . Alcohol use: Yes    Comment: Rarely none last 24hrs  . Drug use: No  . Sexual activity: Not on file  Other Topics Concern  . Not on file  Social History Narrative   Lives in [place] with children 15YO and 20YO. 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       Regular Exercise -  Yes, walk 1 to 3 times a week 30 min   Daily Caffeine Use:  Diet pepsi 1-2         Social Determinants of Health   Financial Resource Strain: Not on file  Food Insecurity: Not on file  Transportation Needs: Not on file  Physical Activity: Not on file  Stress: Not on file  Social Connections: Not on file  Intimate Partner Violence: Not on file    Family History  Problem Relation Age of Onset  . COPD Father   . Cancer Maternal Grandfather        melanoma  . Cancer Paternal Grandmother        lung  . Breast cancer Paternal Grandmother 44  . Colon cancer Neg Hx      Current Outpatient Medications:  .  ADVAIR DISKUS 250-50 MCG/DOSE AEPB, INHALE 1 PUFF BY MOUTH TWICE A DAY, Disp: 180 each, Rfl: 0 .  albuterol (VENTOLIN HFA) 108 (90 Base) MCG/ACT inhaler, INHALE 2 PUFFS INTO THE LUNGS EVERY 4 HOURS AS NEEDED FOR WHEEZE OR FOR SHORTNESS OF BREATH, Disp: 8.5 each, Rfl: 1 .  Biotin 10 MG TABS, Take by mouth., Disp: , Rfl:  .  cholecalciferol (VITAMIN D3) 25 MCG (1000 UNIT) tablet, Take 1,000 Units by mouth daily., Disp: , Rfl:  .  ferrous sulfate 325 (65 FE) MG tablet, Take 325 mg by mouth daily with breakfast., Disp: , Rfl:  .  Multiple Vitamin (MULTIVITAMIN) tablet, Take 1 tablet by mouth daily., Disp: , Rfl:   No results found.  No images are attached to the encounter.   CMP Latest Ref Rng & Units 11/18/2019  Glucose 70 - 99 mg/dL 95  BUN 6 - 23 mg/dL 11  Creatinine 0.40 - 1.20 mg/dL 0.72  Sodium 135 - 145 mEq/L 137  Potassium 3.5 - 5.1 mEq/L 3.9   Chloride 96 - 112 mEq/L 105  CO2 19 - 32 mEq/L 26  Calcium 8.4 - 10.5 mg/dL 8.6  Total Protein 6.0 - 8.3 g/dL 6.8  Total Bilirubin 0.2 - 1.2 mg/dL 0.4  Alkaline Phos 39 - 117 U/L 34(L)  AST 0 - 37 U/L 13  ALT 0 - 35 U/L 13   CBC Latest Ref Rng & Units 10/08/2020  WBC 4.0 - 10.5 K/uL 7.6  Hemoglobin 12.0 - 15.0 g/dL 13.8  Hematocrit 36.0 - 46.0 % 39.1  Platelets 150 - 400 K/uL 277     Observation/objective: Appears in no acute distress over video visit today.  Breathing is nonlabored  Assessment and plan: Patient is a 50 year old female with history of iron deficiency anemia possibly secondary to menorrhagia  Patient is not currently anemic  with a hemoglobin of 13.8.  Ferritin has improved to 28 From a prior value of 7.  Iron indicis are within normal limits.  She does not require any IV iron at this time.  Repeat CBC with an iron studies in 4 and 8 months and I will see her back in 8 months  Follow-up instructions: As above  I discussed the assessment and treatment plan with the patient. The patient was provided an opportunity to ask questions and all were answered. The patient agreed with the plan and demonstrated an understanding of the instructions.   The patient was advised to call back or seek an in-person evaluation if the symptoms worsen or if the condition fails to improve as anticipated.   Visit Diagnosis: 1. Iron deficiency anemia, unspecified iron deficiency anemia type     Dr. Randa Evens, MD, MPH [facility] at [facility] Tel- [phone] 10/12/2020 2:29 PM

## 2020-11-24 ENCOUNTER — Ambulatory Visit: Payer: BC Managed Care – PPO | Admitting: Family

## 2020-12-04 ENCOUNTER — Other Ambulatory Visit: Payer: Self-pay | Admitting: Family

## 2020-12-04 DIAGNOSIS — J4520 Mild intermittent asthma, uncomplicated: Secondary | ICD-10-CM

## 2020-12-06 ENCOUNTER — Other Ambulatory Visit: Payer: Self-pay

## 2020-12-06 DIAGNOSIS — J4520 Mild intermittent asthma, uncomplicated: Secondary | ICD-10-CM

## 2020-12-06 MED ORDER — ALBUTEROL SULFATE HFA 108 (90 BASE) MCG/ACT IN AERS: INHALATION_SPRAY | RESPIRATORY_TRACT | 0 refills | Status: DC

## 2020-12-07 ENCOUNTER — Other Ambulatory Visit: Payer: Self-pay | Admitting: Family

## 2020-12-07 DIAGNOSIS — J4520 Mild intermittent asthma, uncomplicated: Secondary | ICD-10-CM

## 2020-12-15 ENCOUNTER — Encounter: Payer: Self-pay | Admitting: Family

## 2020-12-15 ENCOUNTER — Ambulatory Visit (INDEPENDENT_AMBULATORY_CARE_PROVIDER_SITE_OTHER): Payer: BC Managed Care – PPO | Admitting: Family

## 2020-12-15 ENCOUNTER — Other Ambulatory Visit: Payer: Self-pay

## 2020-12-15 VITALS — BP 102/60 | HR 77 | Temp 98.0°F | Ht 69.0 in | Wt 212.4 lb

## 2020-12-15 DIAGNOSIS — R7301 Impaired fasting glucose: Secondary | ICD-10-CM

## 2020-12-15 DIAGNOSIS — J4520 Mild intermittent asthma, uncomplicated: Secondary | ICD-10-CM | POA: Diagnosis not present

## 2020-12-15 DIAGNOSIS — Z862 Personal history of diseases of the blood and blood-forming organs and certain disorders involving the immune mechanism: Secondary | ICD-10-CM

## 2020-12-15 DIAGNOSIS — D509 Iron deficiency anemia, unspecified: Secondary | ICD-10-CM

## 2020-12-15 DIAGNOSIS — E669 Obesity, unspecified: Secondary | ICD-10-CM

## 2020-12-15 LAB — POCT GLYCOSYLATED HEMOGLOBIN (HGB A1C): Hemoglobin A1C: 4.9 % (ref 4.0–5.6)

## 2020-12-15 NOTE — Assessment & Plan Note (Signed)
Symptoms well controlled. Continue advair 250-50 mcg, and albuterol prn.

## 2020-12-15 NOTE — Assessment & Plan Note (Signed)
Resolved. She will continue to follow with Dr Janese Banks, and colonoscopy this summer.

## 2020-12-15 NOTE — Assessment & Plan Note (Signed)
Congratulated patient on tremendous work, weight loss. She is no longer prediabetic.

## 2020-12-15 NOTE — Progress Notes (Signed)
Subjective:    Patient ID: Hannah Noble, female    DOB: [birth year], 50 y.o.   MRN: [id number]  CC: Hannah Noble is a 50 y.o. female who presents today for follow up.   HPI: Concerned with prediabetes. She would like to have rechecked.  She has been following with [facility], Michaell Cowing, through her employer. She is eating low carbohydrates and higher protein. She has lost 40 lbs.   No depression, anxiety.   Asthma- well controlled. No sob , cough. Compliant with advair, prn albuterol.   Recent consult with Dr Janese Banks- she is not currently anemic and doesn't require IV iron. Plan to follow up in 8 months time.   Capsule study Dr Bonna Gains 07/20/20. Colonoscopy due 03/2021  HISTORY:  Past Medical History:  Diagnosis Date  . Arthritis   . Asthma   . Iron deficiency anemia 03/23/2020   Past Surgical History:  Procedure Laterality Date  . BREAST BIOPSY Right 2010   needle, with Sankar  . COLONOSCOPY WITH PROPOFOL N/A 03/25/2020   Procedure: COLONOSCOPY WITH PROPOFOL;  Surgeon: Virgel Manifold, MD;  Location: [facility];  Service: Endoscopy;  Laterality: N/A;  . ESOPHAGOGASTRODUODENOSCOPY (EGD) WITH PROPOFOL N/A 03/25/2020   Procedure: ESOPHAGOGASTRODUODENOSCOPY (EGD) WITH PROPOFOL;  Surgeon: Virgel Manifold, MD;  Location: [facility];  Service: Endoscopy;  Laterality: N/A;  . ESSURE TUBAL LIGATION  2012   Dr Rayford Halsted  . GIVENS CAPSULE STUDY N/A 07/20/2020   Procedure: GIVENS CAPSULE STUDY;  Surgeon: Virgel Manifold, MD;  Location: [facility];  Service: Endoscopy;  Laterality: N/A;  . TUBAL LIGATION    . VAGINAL DELIVERY     X2   Family History  Problem Relation Age of Onset  . COPD Father   . Cancer Maternal Grandfather        melanoma  . Cancer Paternal Grandmother        lung  . Breast cancer Paternal Grandmother 78  . Colon cancer Neg Hx     Allergies: Patient has no known allergies. Current Outpatient Medications on File Prior to Visit   Medication Sig Dispense Refill  . ADVAIR DISKUS 250-50 MCG/DOSE AEPB TAKE 1 PUFF BY MOUTH TWICE A DAY 180 each 1  . albuterol (VENTOLIN HFA) 108 (90 Base) MCG/ACT inhaler INHALE 2 PUFFS INTO THE LUNGS EVERY 4 HOURS AS NEEDED FOR WHEEZE OR FOR SHORTNESS OF BREATH 8.5 each 0  . Biotin 10 MG TABS Take by mouth.    . cholecalciferol (VITAMIN D3) 25 MCG (1000 UNIT) tablet Take 1,000 Units by mouth daily.    . ferrous sulfate 325 (65 FE) MG tablet Take 325 mg by mouth daily with breakfast.    . Multiple Vitamin (MULTIVITAMIN) tablet Take 1 tablet by mouth daily.     No current facility-administered medications on file prior to visit.    Social History   Tobacco Use  . Smoking status: Former Smoker    Packs/day: 0.50    Types: Cigarettes    Quit date: 09/25/2012    Years since quitting: 8.2  . Smokeless tobacco: Never Used  Vaping Use  . Vaping Use: Every day  Substance Use Topics  . Alcohol use: Yes    Comment: Rarely none last 24hrs  . Drug use: No    Review of Systems  Constitutional: Negative for chills and fever.  Respiratory: Negative for cough.   Cardiovascular: Negative for chest pain and palpitations.  Gastrointestinal: Negative for nausea and vomiting.  Objective:    BP 102/60   Pulse 77   Temp 98 F (36.7 C)   Ht 5\' 9"  (1.753 m)   Wt 212 lb 6.4 oz (96.3 kg)   SpO2 98%   BMI 31.37 kg/m  BP Readings from Last 3 Encounters:  12/15/20 102/60  06/02/20 (!) 154/84  03/25/20 110/73   Wt Readings from Last 3 Encounters:  12/15/20 212 lb 6.4 oz (96.3 kg)  06/02/20 230 lb (104.3 kg)  03/25/20 228 lb (103.4 kg)    Physical Exam Vitals reviewed.  Constitutional:      Appearance: She is well-developed.  Eyes:     Conjunctiva/sclera: Conjunctivae normal.  Cardiovascular:     Rate and Rhythm: Normal rate and regular rhythm.     Pulses: Normal pulses.     Heart sounds: Normal heart sounds.  Pulmonary:     Effort: Pulmonary effort is normal.     Breath  sounds: Normal breath sounds. No wheezing, rhonchi or rales.  Skin:    General: Skin is warm and dry.  Neurological:     Mental Status: She is alert.  Psychiatric:        Speech: Speech normal.        Behavior: Behavior normal.        Thought Content: Thought content normal.        Assessment & Plan:   Problem List Items Addressed This Visit      Respiratory   Asthma in adult    Symptoms well controlled. Continue advair 250-50 mcg, and albuterol prn.         Other   Iron deficiency anemia    Resolved. She will continue to follow with Dr Janese Banks, and colonoscopy this summer.       Obesity (BMI 30-39.9)    Congratulated patient on tremendous work, weight loss. She is no longer prediabetic.        Other Visit Diagnoses    Elevated fasting glucose    -  Primary   Relevant Orders   POCT HgB A1C (Completed)   Basic metabolic panel   Lipid panel       I am having Hannah Noble maintain her multivitamin, cholecalciferol, Biotin, ferrous sulfate, albuterol, and Advair Diskus.   No orders of the defined types were placed in this encounter.   Return precautions given.   Risks, benefits, and alternatives of the medications and treatment plan prescribed today were discussed, and patient expressed understanding.   Education regarding symptom management and diagnosis given to patient on AVS.  Continue to follow with Burnard Hawthorne, FNP for routine health maintenance.   Hannah Noble and I agreed with plan.   Mable Paris, FNP

## 2020-12-31 ENCOUNTER — Other Ambulatory Visit: Payer: Self-pay | Admitting: Family

## 2020-12-31 DIAGNOSIS — J4520 Mild intermittent asthma, uncomplicated: Secondary | ICD-10-CM

## 2021-01-22 ENCOUNTER — Other Ambulatory Visit: Payer: Self-pay | Admitting: Family

## 2021-01-22 DIAGNOSIS — J4520 Mild intermittent asthma, uncomplicated: Secondary | ICD-10-CM

## 2021-02-08 ENCOUNTER — Ambulatory Visit (INDEPENDENT_AMBULATORY_CARE_PROVIDER_SITE_OTHER): Payer: BC Managed Care – PPO | Admitting: Family

## 2021-02-08 ENCOUNTER — Other Ambulatory Visit: Payer: Self-pay

## 2021-02-08 ENCOUNTER — Encounter: Payer: Self-pay | Admitting: Family

## 2021-02-08 VITALS — BP 120/76 | HR 79 | Temp 98.1°F | Ht 68.0 in | Wt 205.8 lb

## 2021-02-08 DIAGNOSIS — D509 Iron deficiency anemia, unspecified: Secondary | ICD-10-CM

## 2021-02-08 DIAGNOSIS — Z1159 Encounter for screening for other viral diseases: Secondary | ICD-10-CM

## 2021-02-08 DIAGNOSIS — Z1231 Encounter for screening mammogram for malignant neoplasm of breast: Secondary | ICD-10-CM

## 2021-02-08 DIAGNOSIS — Z0000 Encounter for general adult medical examination without abnormal findings: Secondary | ICD-10-CM | POA: Diagnosis not present

## 2021-02-08 DIAGNOSIS — Z8639 Personal history of other endocrine, nutritional and metabolic disease: Secondary | ICD-10-CM | POA: Diagnosis not present

## 2021-02-08 LAB — LIPID PANEL
Cholesterol: 176 mg/dL (ref 0–200)
HDL: 69.7 mg/dL (ref >39.00)
LDL Cholesterol: 94 mg/dL (ref 0–99)
NonHDL: 106.02
Total CHOL/HDL Ratio: 3
Triglycerides: 59 mg/dL (ref 0.0–149.0)
VLDL: 11.8 mg/dL (ref 0.0–40.0)

## 2021-02-08 LAB — IBC + FERRITIN
Ferritin: 34.6 ng/mL (ref 10.0–291.0)
Iron: 121 ug/dL (ref 42–145)
Saturation Ratios: 31 % (ref 20.0–50.0)
Transferrin: 279 mg/dL (ref 212.0–360.0)

## 2021-02-08 LAB — B12 AND FOLATE PANEL
Folate: >24.4 ng/mL (ref >5.9)
Vitamin B-12: 1194 pg/mL — ABNORMAL HIGH (ref 211–911)

## 2021-02-08 NOTE — Assessment & Plan Note (Signed)
Mammogram ordered and patient will schedule in 2 months. Deferred pelvic exam in the absence of complains and pap smear UTD Note: We discussed relevant and past history of abnormal labs today at length, we decided to order appropriate labs for screening today based on this discussion. Patient was comfortable with this. Patient was advised if any symptoms were to change after today's visit, to notify me as we may always order additional labs.

## 2021-02-08 NOTE — Progress Notes (Signed)
Subjective:    Patient ID: Hannah Noble, female    DOB: 06-Jan-1971, 50 y.o.   MRN: [id number]  CC: Hannah Noble is a 50 y.o. female who presents today for physical exam.    HPI: Feels well today No concerns No depression.   Anemia resolved. She takes women's one a day MV.  No longer prediabetic and has remained focus on lifestyle changes, eating healthy and weight loss. She periodically checks her blood glucose.      Colorectal Cancer Screening: UTD, 03/2020 Breast Cancer Screening: Mammogram UTD  Cervical Cancer Screening: UTD 2 years ago Bone Health screening/DEXA for 65+: No increased fracture risk. Defer screening at this time  Lung Cancer Screening: Doesn't have 20 year pack year history and age > 61 years yo 9 years. We discussed that we will do in the future at age 28.  Hepatitis C screening - Candidate for, consents Labs: Screening labs today. Alcohol use:  rare Smoking/tobacco use: former smoker.     HISTORY:  Past Medical History:  Diagnosis Date  . Arthritis   . Asthma   . Iron deficiency anemia 03/23/2020    Past Surgical History:  Procedure Laterality Date  . BREAST BIOPSY Right 2010   needle, with Sankar  . COLONOSCOPY WITH PROPOFOL N/A 03/25/2020   Procedure: COLONOSCOPY WITH PROPOFOL;  Surgeon: Virgel Manifold, MD;  Location: [facility];  Service: Endoscopy;  Laterality: N/A;  . ESOPHAGOGASTRODUODENOSCOPY (EGD) WITH PROPOFOL N/A 03/25/2020   Procedure: ESOPHAGOGASTRODUODENOSCOPY (EGD) WITH PROPOFOL;  Surgeon: Virgel Manifold, MD;  Location: [facility];  Service: Endoscopy;  Laterality: N/A;  . ESSURE TUBAL LIGATION  2012   Dr Rayford Halsted  . GIVENS CAPSULE STUDY N/A 07/20/2020   Procedure: GIVENS CAPSULE STUDY;  Surgeon: Virgel Manifold, MD;  Location: [facility];  Service: Endoscopy;  Laterality: N/A;  . TUBAL LIGATION    . VAGINAL DELIVERY     X2   Family History  Problem Relation Age of Onset  . COPD Father   .  Cancer Maternal Grandfather        melanoma  . Cancer Paternal Grandmother        lung  . Breast cancer Paternal Grandmother 30  . Colon cancer Neg Hx       ALLERGIES: Patient has no known allergies.  Current Outpatient Medications on File Prior to Visit  Medication Sig Dispense Refill  . ADVAIR DISKUS 250-50 MCG/DOSE AEPB TAKE 1 PUFF BY MOUTH TWICE A DAY 180 each 1  . albuterol (VENTOLIN HFA) 108 (90 Base) MCG/ACT inhaler INHALE 2 PUFFS INTO THE LUNGS EVERY 4 HOURS AS NEEDED FOR WHEEZE OR FOR SHORTNESS OF BREATH 8.5 each 0  . Biotin 10 MG TABS Take by mouth.    . cholecalciferol (VITAMIN D3) 25 MCG (1000 UNIT) tablet Take 1,000 Units by mouth daily.    . Multiple Vitamin (MULTIVITAMIN) tablet Take 1 tablet by mouth daily.    . vitamin B-12 (CYANOCOBALAMIN) 500 MCG tablet Take 500 mcg by mouth daily.     No current facility-administered medications on file prior to visit.    Social History   Tobacco Use  . Smoking status: Former Smoker    Packs/day: 0.50    Types: Cigarettes    Quit date: 09/25/2012    Years since quitting: 8.3  . Smokeless tobacco: Never Used  Vaping Use  . Vaping Use: Every day  Substance Use Topics  . Alcohol use: Yes    Comment: Rarely none  last 24hrs  . Drug use: No    Review of Systems  Constitutional: Negative for chills, fatigue, fever and unexpected weight change.  HENT: Negative for congestion.   Respiratory: Negative for cough.   Cardiovascular: Negative for chest pain, palpitations and leg swelling.  Gastrointestinal: Negative for nausea and vomiting.  Musculoskeletal: Negative for arthralgias and myalgias.  Skin: Negative for rash.  Neurological: Negative for headaches.  Hematological: Negative for adenopathy.  Psychiatric/Behavioral: Negative for confusion.      Objective:    BP 120/76 (BP Location: Left Arm, Patient Position: Sitting, Cuff Size: Large)   Pulse 79   Temp 98.1 F (36.7 C) (Oral)   Ht 5\' 8"  (1.727 m)   Wt 205 lb  12.8 oz (93.4 kg)   SpO2 97%   BMI 31.29 kg/m   BP Readings from Last 3 Encounters:  02/08/21 120/76  12/15/20 102/60  06/02/20 (!) 154/84   Wt Readings from Last 3 Encounters:  02/08/21 205 lb 12.8 oz (93.4 kg)  12/15/20 212 lb 6.4 oz (96.3 kg)  06/02/20 230 lb (104.3 kg)    Physical Exam Vitals reviewed.  Constitutional:      Appearance: Normal appearance. She is well-developed.  Eyes:     Conjunctiva/sclera: Conjunctivae normal.  Neck:     Thyroid: No thyroid mass or thyromegaly.  Cardiovascular:     Rate and Rhythm: Normal rate and regular rhythm.     Pulses: Normal pulses.     Heart sounds: Normal heart sounds.  Pulmonary:     Effort: Pulmonary effort is normal.     Breath sounds: Normal breath sounds. No wheezing, rhonchi or rales.  Chest:  Breasts: Breasts are symmetrical.     Right: No inverted nipple, mass, nipple discharge, skin change or tenderness.     Left: No inverted nipple, mass, nipple discharge, skin change or tenderness.    Abdominal:     General: Bowel sounds are normal. There is no distension.     Palpations: Abdomen is soft. Abdomen is not rigid. There is no fluid wave or mass.     Tenderness: There is no abdominal tenderness. There is no guarding or rebound.  Lymphadenopathy:     Head:     Right side of head: No submental, submandibular, tonsillar, preauricular, posterior auricular or occipital adenopathy.     Left side of head: No submental, submandibular, tonsillar, preauricular, posterior auricular or occipital adenopathy.     Cervical: No cervical adenopathy.     Right cervical: No superficial, deep or posterior cervical adenopathy.    Left cervical: No superficial, deep or posterior cervical adenopathy.  Skin:    General: Skin is warm and dry.  Neurological:     Mental Status: She is alert.  Psychiatric:        Speech: Speech normal.        Behavior: Behavior normal.        Thought Content: Thought content normal.         Assessment & Plan:   Problem List Items Addressed This Visit      Other   Iron deficiency anemia    Resolved. Pending labs to evaluate iron stores.       Relevant Medications   vitamin B-12 (CYANOCOBALAMIN) 500 MCG tablet   Routine general medical examination at a health care facility - Primary    Mammogram ordered and patient will schedule in 2 months. Deferred pelvic exam in the absence of complains and pap smear UTD Note: We discussed  relevant and past history of abnormal labs today at length, we decided to order appropriate labs for screening today based on this discussion. Patient was comfortable with this. Patient was advised if any symptoms were to change after today's visit, to notify me as we may always order additional labs.       Relevant Orders   MM 3D SCREEN BREAST BILATERAL   IBC + Ferritin   B12 and Folate Panel   Intrinsic Factor Antibodies   Methylmalonic acid, serum   Anti-parietal antibody   Lipid panel   Screening for breast cancer   Relevant Orders   MM 3D SCREEN BREAST BILATERAL    Other Visit Diagnoses    Encounter for hepatitis C screening test for low risk patient       Relevant Orders   Hepatitis C antibody   History of vitamin D deficiency       Relevant Orders   IBC + Ferritin   B12 and Folate Panel   Intrinsic Factor Antibodies   Methylmalonic acid, serum   Anti-parietal antibody       I have discontinued Sudiksha L. Glover's ferrous sulfate. I am also having her maintain her multivitamin, cholecalciferol, Biotin, Advair Diskus, albuterol, and vitamin B-12.   No orders of the defined types were placed in this encounter.   Return precautions given.   Risks, benefits, and alternatives of the medications and treatment plan prescribed today were discussed, and patient expressed understanding.   Education regarding symptom management and diagnosis given to patient on AVS.   Continue to follow with Burnard Hawthorne, FNP for routine  health maintenance.   Hannah Noble and I agreed with plan.   Mable Paris, FNP

## 2021-02-08 NOTE — Patient Instructions (Addendum)
Very nice to see you.   We will start CT lung cancer screening program; we will start at 48 years of age.   [email or web address]   Please call  and schedule your 3D mammogram   [facility]  [place], [place]   Health Maintenance, Female Adopting a healthy lifestyle and getting preventive care are important in promoting health and wellness. Ask your health care provider about:  The right schedule for you to have regular tests and exams.  Things you can do on your own to prevent diseases and keep yourself healthy. What should I know about diet, weight, and exercise? Eat a healthy diet  Eat a diet that includes plenty of vegetables, fruits, low-fat dairy products, and lean protein.  Do not eat a lot of foods that are high in solid fats, added sugars, or sodium.   Maintain a healthy weight Body mass index (BMI) is used to identify weight problems. It estimates body fat based on height and weight. Your health care provider can help determine your BMI and help you achieve or maintain a healthy weight. Get regular exercise Get regular exercise. This is one of the most important things you can do for your health. Most adults should:  Exercise for at least 150 minutes each week. The exercise should increase your heart rate and make you sweat (moderate-intensity exercise).  Do strengthening exercises at least twice a week. This is in addition to the moderate-intensity exercise.  Spend less time sitting. Even light physical activity can be beneficial. Watch cholesterol and blood lipids Have your blood tested for lipids and cholesterol at 48 years of age, then have this test every 5 years. Have your cholesterol levels checked more often if:  Your lipid or cholesterol levels are high.  You are older than 49 years of age.  You are at high risk for heart  disease. What should I know about cancer screening? Depending on your health history and family history, you may need to have cancer screening at various ages. This may include screening for:  Breast cancer.  Cervical cancer.  Colorectal cancer.  Skin cancer.  Lung cancer. What should I know about heart disease, diabetes, and high blood pressure? Blood pressure and heart disease  High blood pressure causes heart disease and increases the risk of stroke. This is more likely to develop in people who have high blood pressure readings, are of African descent, or are overweight.  Have your blood pressure checked: ? Every 3-5 years if you are 57-36 years of age. ? Every year if you are 9 years old or older. Diabetes Have regular diabetes screenings. This checks your fasting blood sugar level. Have the screening done:  Once every three years after age 71 if you are at a normal weight and have a low risk for diabetes.  More often and at a younger age if you are overweight or have a high risk for diabetes. What should I know about preventing infection? Hepatitis B If you have a higher risk for hepatitis B, you should be screened for this virus. Talk with your health care provider to find out if you are at risk for hepatitis B infection. Hepatitis C Testing is recommended for:  Everyone born from [birth year] through 1963.  Anyone with known risk factors for hepatitis C. Sexually transmitted infections (STIs)  Get screened for STIs, including gonorrhea and chlamydia, if: ? You are sexually active and are younger than 48 years  of age. ? You are older than 48 years of age and your health care provider tells you that you are at risk for this type of infection. ? Your sexual activity has changed since you were last screened, and you are at increased risk for chlamydia or gonorrhea. Ask your health care provider if you are at risk.  Ask your health care provider about whether you are at high  risk for HIV. Your health care provider may recommend a prescription medicine to help prevent HIV infection. If you choose to take medicine to prevent HIV, you should first get tested for HIV. You should then be tested every 3 months for as long as you are taking the medicine. Pregnancy  If you are about to stop having your period (premenopausal) and you may become pregnant, seek counseling before you get pregnant.  Take 400 to 800 micrograms (mcg) of folic acid every day if you become pregnant.  Ask for birth control (contraception) if you want to prevent pregnancy. Osteoporosis and menopause Osteoporosis is a disease in which the bones lose minerals and strength with aging. This can result in bone fractures. If you are 14 years old or older, or if you are at risk for osteoporosis and fractures, ask your health care provider if you should:  Be screened for bone loss.  Take a calcium or vitamin D supplement to lower your risk of fractures.  Be given hormone replacement therapy (HRT) to treat symptoms of menopause. Follow these instructions at home: Lifestyle  Do not use any products that contain nicotine or tobacco, such as cigarettes, e-cigarettes, and chewing tobacco. If you need help quitting, ask your health care provider.  Do not use street drugs.  Do not share needles.  Ask your health care provider for help if you need support or information about quitting drugs. Alcohol use  Do not drink alcohol if: ? Your health care provider tells you not to drink. ? You are pregnant, may be pregnant, or are planning to become pregnant.  If you drink alcohol: ? Limit how much you use to 0-1 drink a day. ? Limit intake if you are breastfeeding.  Be aware of how much alcohol is in your drink. In the [place] one drink equals one 12 oz bottle of beer (355 mL), one 5 oz glass of wine (148 mL), or one 1 oz glass of hard liquor (44 mL). General instructions  Schedule regular health, dental,  and eye exams.  Stay current with your vaccines.  Tell your health care provider if: ? You often feel depressed. ? You have ever been abused or do not feel safe at home. Summary  Adopting a healthy lifestyle and getting preventive care are important in promoting health and wellness.  Follow your health care provider's instructions about healthy diet, exercising, and getting tested or screened for diseases.  Follow your health care provider's instructions on monitoring your cholesterol and blood pressure. This information is not intended to replace advice given to you by your health care provider. Make sure you discuss any questions you have with your health care provider. Document Revised: 09/04/2018 Document Reviewed: 09/04/2018 Elsevier Patient Education  2021 [employer].

## 2021-02-08 NOTE — Assessment & Plan Note (Signed)
Resolved. Pending labs to evaluate iron stores.

## 2021-02-09 ENCOUNTER — Other Ambulatory Visit: Payer: BC Managed Care – PPO

## 2021-02-09 ENCOUNTER — Inpatient Hospital Stay: Payer: BC Managed Care – PPO

## 2021-02-09 ENCOUNTER — Other Ambulatory Visit: Payer: Self-pay | Admitting: Family

## 2021-02-09 DIAGNOSIS — J4520 Mild intermittent asthma, uncomplicated: Secondary | ICD-10-CM

## 2021-02-11 LAB — HEPATITIS C ANTIBODY
Hepatitis C Ab: NONREACTIVE
SIGNAL TO CUT-OFF: 0.02 (ref <1.00)

## 2021-02-11 LAB — METHYLMALONIC ACID, SERUM: Methylmalonic Acid, Quant: 56 nmol/L — ABNORMAL LOW (ref 87–318)

## 2021-02-11 LAB — ANTI-PARIETAL ANTIBODY: PARIETAL CELL AB SCREEN: NEGATIVE

## 2021-02-11 LAB — INTRINSIC FACTOR ANTIBODIES: Intrinsic Factor: NEGATIVE

## 2021-02-14 ENCOUNTER — Encounter: Payer: Self-pay | Admitting: Family

## 2021-02-14 DIAGNOSIS — E538 Deficiency of other specified B group vitamins: Secondary | ICD-10-CM | POA: Insufficient documentation

## 2021-02-27 ENCOUNTER — Other Ambulatory Visit: Payer: Self-pay | Admitting: Family

## 2021-02-27 DIAGNOSIS — J4520 Mild intermittent asthma, uncomplicated: Secondary | ICD-10-CM

## 2021-02-28 ENCOUNTER — Encounter: Payer: Self-pay | Admitting: Family

## 2021-03-27 ENCOUNTER — Other Ambulatory Visit: Payer: Self-pay | Admitting: Family

## 2021-03-27 DIAGNOSIS — J4520 Mild intermittent asthma, uncomplicated: Secondary | ICD-10-CM

## 2021-04-09 ENCOUNTER — Other Ambulatory Visit: Payer: Self-pay | Admitting: Family

## 2021-04-09 DIAGNOSIS — J4520 Mild intermittent asthma, uncomplicated: Secondary | ICD-10-CM

## 2021-05-10 ENCOUNTER — Other Ambulatory Visit: Payer: Self-pay | Admitting: Family

## 2021-05-10 DIAGNOSIS — J4520 Mild intermittent asthma, uncomplicated: Secondary | ICD-10-CM

## 2021-05-16 ENCOUNTER — Other Ambulatory Visit: Payer: Self-pay | Admitting: Family

## 2021-05-16 DIAGNOSIS — J4520 Mild intermittent asthma, uncomplicated: Secondary | ICD-10-CM

## 2021-06-10 ENCOUNTER — Telehealth: Payer: Self-pay | Admitting: Oncology

## 2021-06-10 NOTE — Telephone Encounter (Signed)
Patient called to cancel lab and MyChart visit on 9/19 and 9/22 she will have to call back to reschedule as she does not have any time off at work.

## 2021-06-13 ENCOUNTER — Inpatient Hospital Stay: Payer: BC Managed Care – PPO

## 2021-06-16 ENCOUNTER — Inpatient Hospital Stay: Payer: BC Managed Care – PPO | Admitting: Nurse Practitioner

## 2021-06-16 ENCOUNTER — Telehealth: Payer: BC Managed Care – PPO | Admitting: Oncology

## 2021-06-17 ENCOUNTER — Other Ambulatory Visit: Payer: Self-pay | Admitting: Family

## 2021-06-17 DIAGNOSIS — Z1231 Encounter for screening mammogram for malignant neoplasm of breast: Secondary | ICD-10-CM

## 2021-06-30 ENCOUNTER — Other Ambulatory Visit: Payer: Self-pay | Admitting: Family

## 2021-06-30 DIAGNOSIS — J4520 Mild intermittent asthma, uncomplicated: Secondary | ICD-10-CM

## 2021-07-04 ENCOUNTER — Other Ambulatory Visit: Payer: Self-pay

## 2021-07-04 ENCOUNTER — Ambulatory Visit
Admission: RE | Admit: 2021-07-04 | Discharge: 2021-07-04 | Disposition: A | Discharge status: Home / Self Care | Payer: BC Managed Care – PPO | Source: Ambulatory Visit | Attending: Family | Admitting: Family

## 2021-07-04 DIAGNOSIS — Z1231 Encounter for screening mammogram for malignant neoplasm of breast: Secondary | ICD-10-CM | POA: Insufficient documentation

## 2021-07-07 ENCOUNTER — Other Ambulatory Visit: Payer: Self-pay | Admitting: Family

## 2021-07-07 DIAGNOSIS — R928 Other abnormal and inconclusive findings on diagnostic imaging of breast: Secondary | ICD-10-CM

## 2021-07-07 DIAGNOSIS — N6489 Other specified disorders of breast: Secondary | ICD-10-CM

## 2021-07-08 ENCOUNTER — Telehealth: Payer: Self-pay

## 2021-07-08 NOTE — Telephone Encounter (Signed)
LMTCB for mammogram results.  

## 2021-07-11 ENCOUNTER — Encounter: Payer: Self-pay | Admitting: Family

## 2021-07-11 NOTE — Telephone Encounter (Signed)
Patient is returning your call,please call her at [phone].

## 2021-07-11 NOTE — Telephone Encounter (Signed)
Patient also sent mychart & I have corresponded with patient.

## 2021-07-19 ENCOUNTER — Ambulatory Visit
Admission: RE | Admit: 2021-07-19 | Discharge: 2021-07-19 | Disposition: A | Discharge status: Home / Self Care | Payer: BC Managed Care – PPO | Source: Ambulatory Visit | Attending: Family | Admitting: Family

## 2021-07-19 ENCOUNTER — Other Ambulatory Visit: Payer: Self-pay

## 2021-07-19 DIAGNOSIS — N6489 Other specified disorders of breast: Secondary | ICD-10-CM | POA: Diagnosis not present

## 2021-07-19 DIAGNOSIS — R928 Other abnormal and inconclusive findings on diagnostic imaging of breast: Secondary | ICD-10-CM | POA: Insufficient documentation

## 2021-07-19 DIAGNOSIS — R922 Inconclusive mammogram: Secondary | ICD-10-CM | POA: Diagnosis not present

## 2021-07-21 ENCOUNTER — Other Ambulatory Visit: Payer: Self-pay | Admitting: Family

## 2021-07-21 DIAGNOSIS — J4520 Mild intermittent asthma, uncomplicated: Secondary | ICD-10-CM

## 2021-08-05 DIAGNOSIS — B354 Tinea corporis: Secondary | ICD-10-CM | POA: Diagnosis not present

## 2021-08-13 ENCOUNTER — Other Ambulatory Visit: Payer: Self-pay | Admitting: Family

## 2021-08-13 DIAGNOSIS — J4520 Mild intermittent asthma, uncomplicated: Secondary | ICD-10-CM

## 2021-09-01 ENCOUNTER — Other Ambulatory Visit: Payer: Self-pay | Admitting: Family

## 2021-09-01 DIAGNOSIS — J4520 Mild intermittent asthma, uncomplicated: Secondary | ICD-10-CM

## 2021-09-26 ENCOUNTER — Other Ambulatory Visit: Payer: Self-pay | Admitting: Family

## 2021-09-26 DIAGNOSIS — J4520 Mild intermittent asthma, uncomplicated: Secondary | ICD-10-CM

## 2021-10-15 DIAGNOSIS — R7303 Prediabetes: Secondary | ICD-10-CM | POA: Diagnosis not present

## 2021-10-29 ENCOUNTER — Other Ambulatory Visit: Payer: Self-pay | Admitting: Family

## 2021-10-29 DIAGNOSIS — J4520 Mild intermittent asthma, uncomplicated: Secondary | ICD-10-CM

## 2021-11-12 ENCOUNTER — Other Ambulatory Visit: Payer: Self-pay | Admitting: Family

## 2021-11-12 DIAGNOSIS — J4520 Mild intermittent asthma, uncomplicated: Secondary | ICD-10-CM

## 2021-11-15 DIAGNOSIS — R7303 Prediabetes: Secondary | ICD-10-CM | POA: Diagnosis not present

## 2021-12-13 DIAGNOSIS — R7303 Prediabetes: Secondary | ICD-10-CM | POA: Diagnosis not present

## 2022-01-13 DIAGNOSIS — R7303 Prediabetes: Secondary | ICD-10-CM | POA: Diagnosis not present

## 2022-01-15 IMAGING — MG MM DIGITAL DIAGNOSTIC UNILAT*R* W/ TOMO W/ CAD
6 series · 6 of 18 positions shown · non-contrast
Comparison: Previous exam(s).

CLINICAL DATA: Patient returns after screening study for evaluation
of possible RIGHT breast asymmetry.

EXAM:
DIGITAL DIAGNOSTIC UNILATERAL RIGHT MAMMOGRAM WITH TOMOSYNTHESIS AND
CAD
TECHNIQUE: Right digital diagnostic mammography and breast tomosynthesis was
performed. The images were evaluated with computer-aided detection.

[R MLO synth-2D]
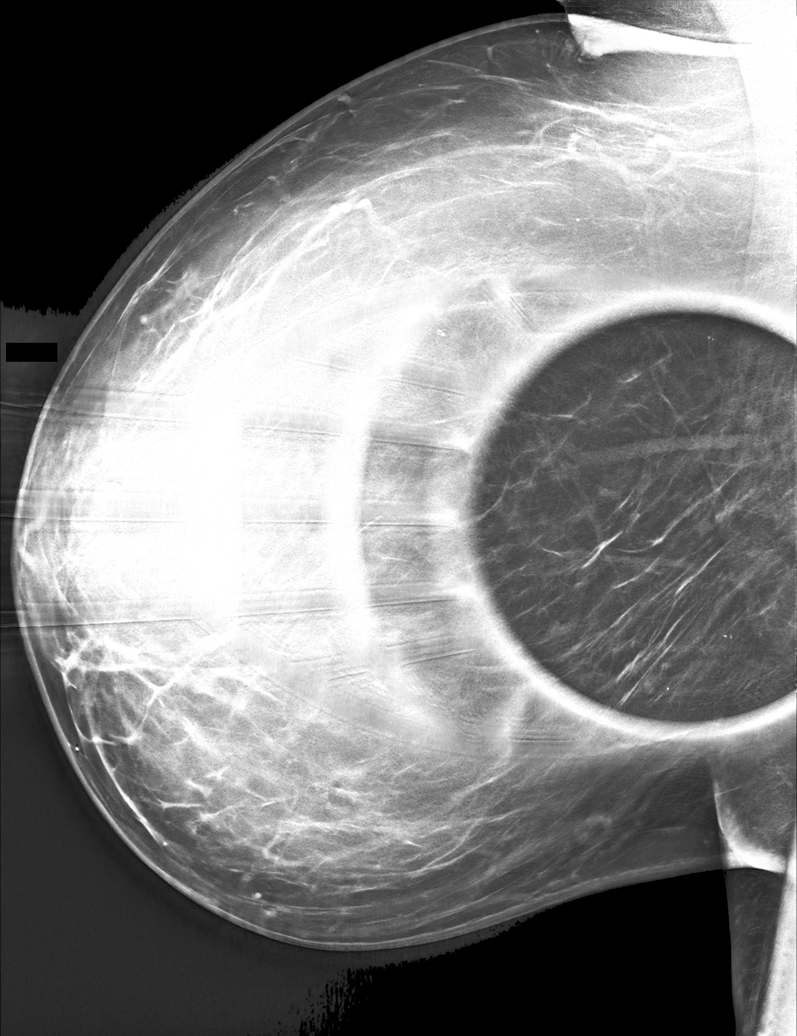

[R ML synth-2D (1 of 2)]
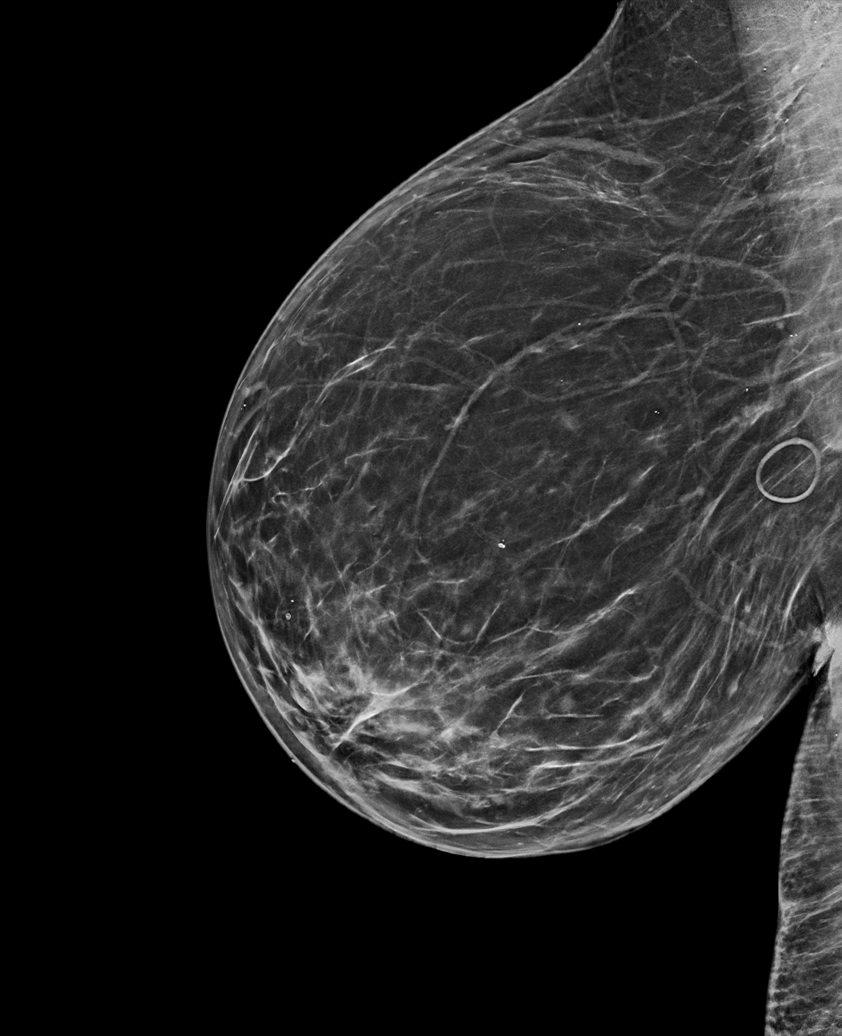

[R ML synth-2D (2 of 2)]
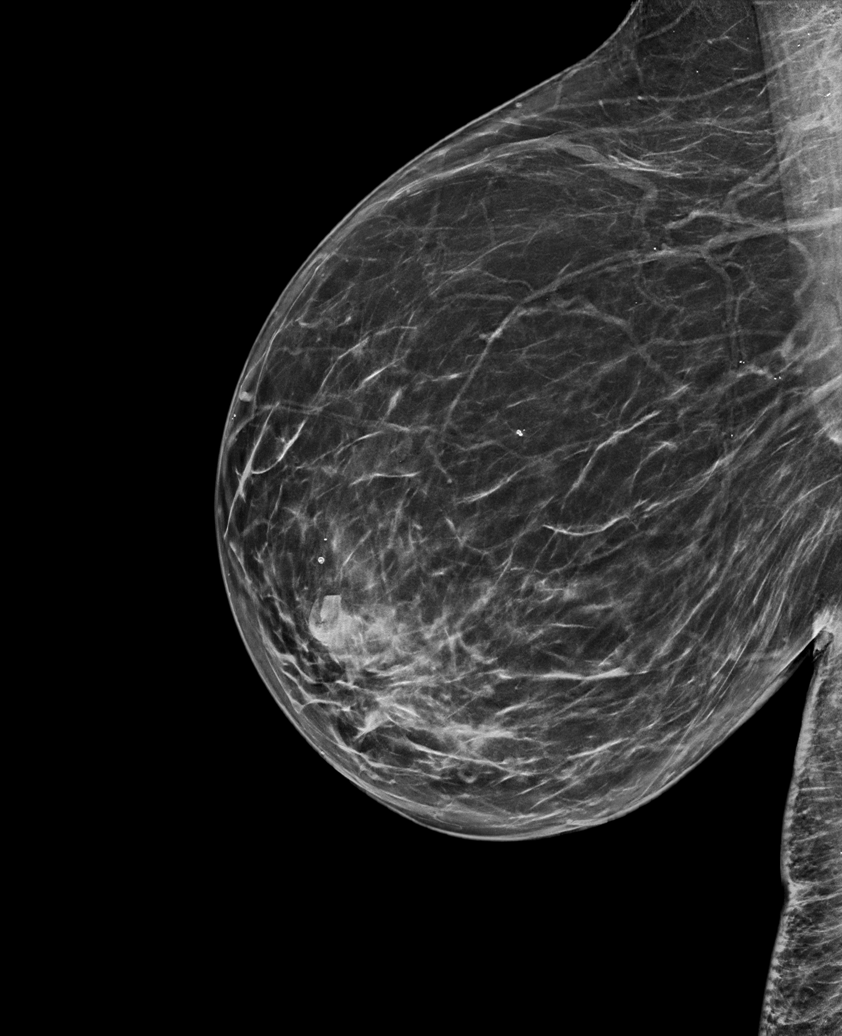

[R ML tomo (1 of 2) · tomo slice 37/74.0]
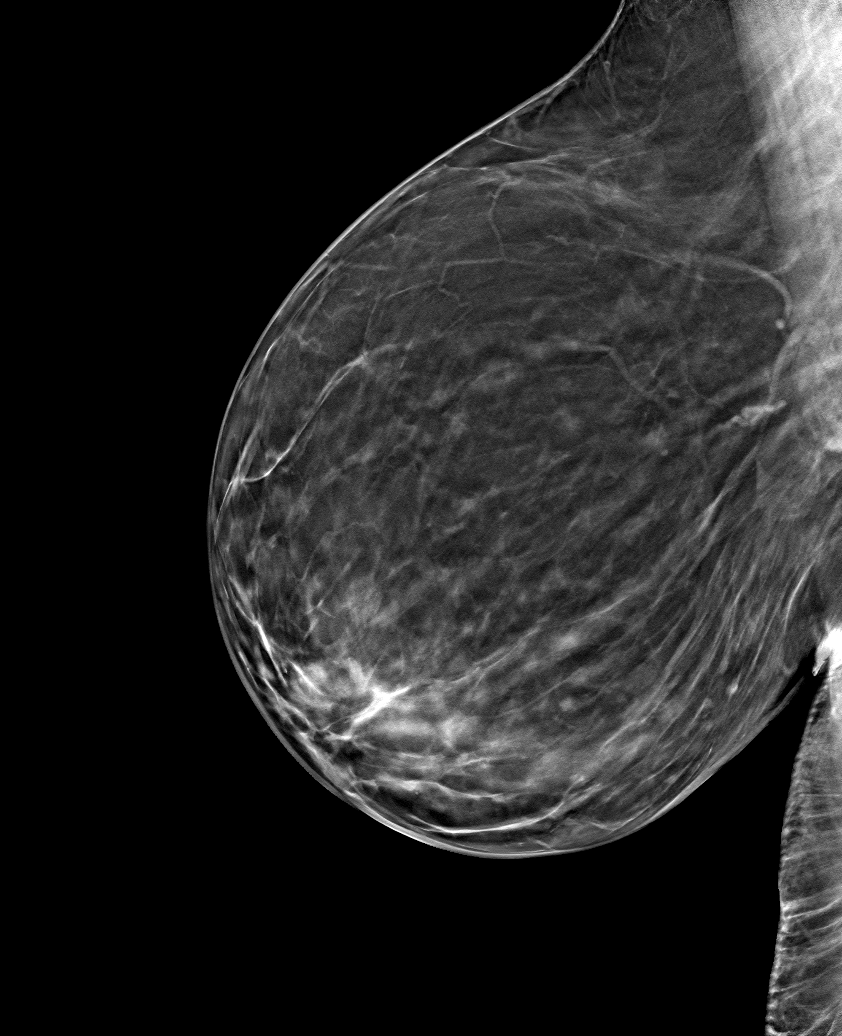

[R MLO tomo · tomo slice 32/63.0]
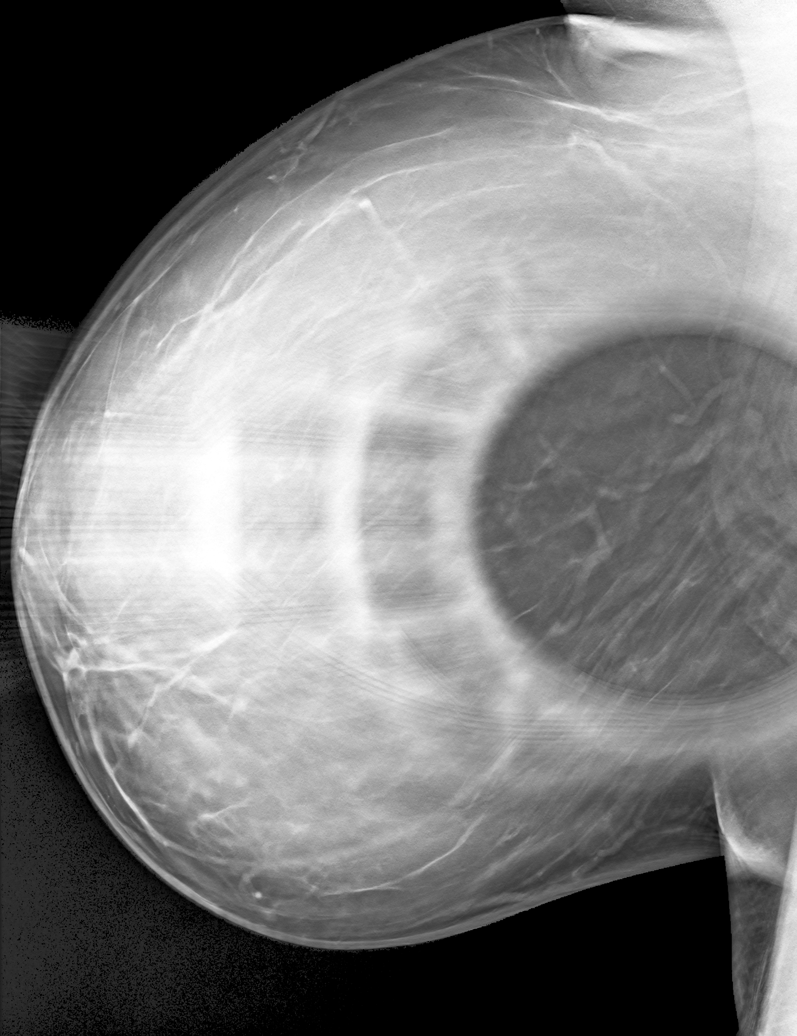

[R ML tomo (2 of 2) · tomo slice 38/75.0]
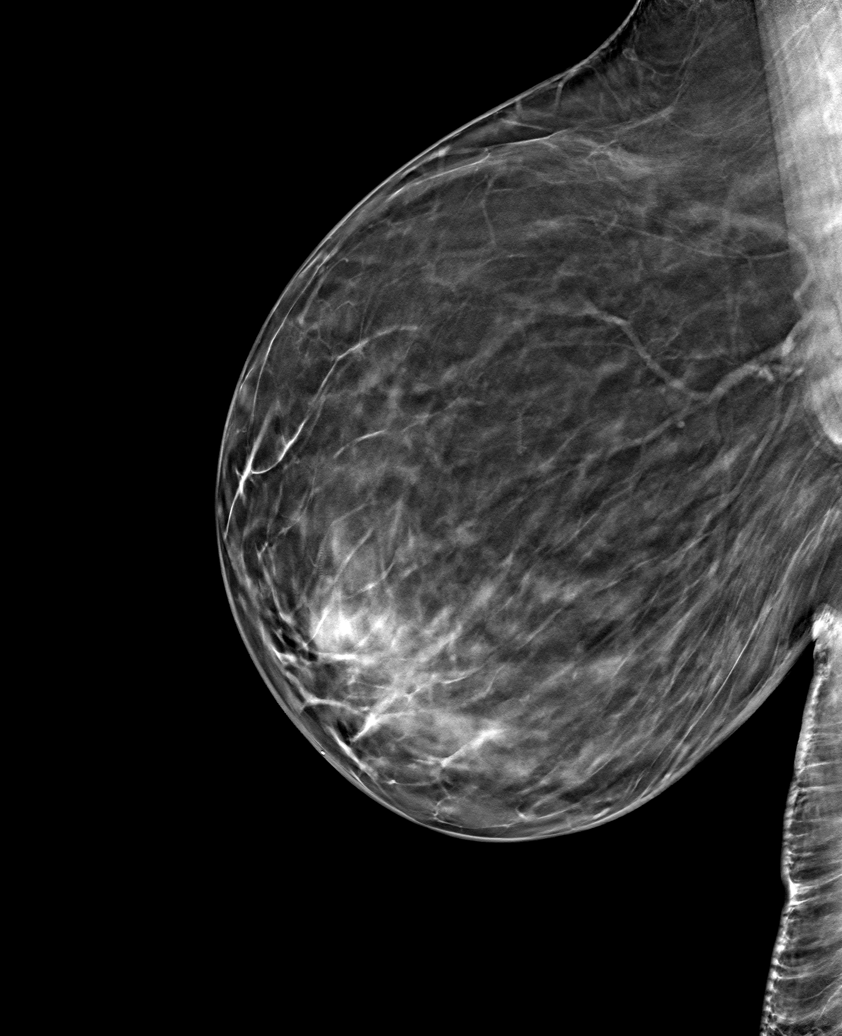

[6 of 18 positions shown; findings below may reference images not displayed]

ACR Breast Density Category b: There are scattered areas of
fibroglandular density.
FINDINGS: Additional 2-D and 3-D images are performed. These views show no
persistent asymmetry in the posterosuperior portion of the RIGHT
breast.
IMPRESSION: No mammographic evidence for malignancy.

RECOMMENDATION:
Screening mammogram in one year.(Code:AR-T-C4A)

I have discussed the findings and recommendations with the patient.
If applicable, a reminder letter will be sent to the patient
regarding the next appointment.

BI-RADS CATEGORY  1: Negative.

## 2022-01-24 ENCOUNTER — Other Ambulatory Visit: Payer: Self-pay | Admitting: Family

## 2022-01-24 DIAGNOSIS — J4520 Mild intermittent asthma, uncomplicated: Secondary | ICD-10-CM

## 2022-02-12 DIAGNOSIS — R7303 Prediabetes: Secondary | ICD-10-CM | POA: Diagnosis not present

## 2022-03-09 ENCOUNTER — Ambulatory Visit: Payer: 59 | Admitting: Family

## 2022-03-09 ENCOUNTER — Encounter: Payer: Self-pay | Admitting: Family

## 2022-03-09 VITALS — BP 130/76 | HR 89 | Temp 98.1°F | Wt 236.6 lb

## 2022-03-09 DIAGNOSIS — Z1211 Encounter for screening for malignant neoplasm of colon: Secondary | ICD-10-CM

## 2022-03-09 DIAGNOSIS — B372 Candidiasis of skin and nail: Secondary | ICD-10-CM | POA: Diagnosis not present

## 2022-03-09 MED ORDER — FLUCONAZOLE 150 MG PO TABS: 150.0000 mg | ORAL_TABLET | Freq: Once | ORAL | 1 refills | Status: AC

## 2022-03-09 MED ORDER — DOXYCYCLINE HYCLATE 100 MG PO TABS: 100.0000 mg | ORAL_TABLET | Freq: Two times a day (BID) | ORAL | 0 refills | Status: AC

## 2022-03-09 NOTE — Assessment & Plan Note (Addendum)
Due to extensive rash, I prescribed diflucan as may be more beneficial than terbinafine at this time.  I have however refilled terbinafine for her to use for localized infection in the future.  Concern for associated folliculitis, early abscess suprapubic and groin area. Presentation also raises concern for hidradenitis suppurativa. She will start doxycycline.  Encouraged probiotics.  She will have close follow-up with colleague next week as I am out of the office to ensure she is improving.  Referral to dermatology is in place for further evaluation, diagnosis.

## 2022-03-09 NOTE — Progress Notes (Signed)
Subjective:    Patient ID: Hannah Noble, female    DOB: 1970-11-22, 51 y.o.   MRN: [id number]  CC: Hannah Noble is a 51 y.o. female who presents today for follow up.   HPI: Complains of inguinal rash bilaterally  for over a year.   Rash is pruritic, not painful. No exudate. She tried to clean and dry.  No vaginal pain, urinary incontinence. She also has bumps in the area. She will get similar bumps under her axilla. No h/o mrsa.  She was seen by e visit in the Fall whom prescribed terbinafine with some relief. Previously taken diflucan with relief as well.    Complains of inguinal rash bilaterally  for over a year.   Rash is pruritic, not painful. No exudate. She tried to clean and dry.  No vaginal pain, urinary incontinence. She also has bumps in the area. She will get similar bumps under her axilla. No h/o mrsa.  She was seen by e visit in the Fall whom prescribed terbinafine with some relief. Previously taken diflucan with relief as well.   HISTORY:  Past Medical History:  Diagnosis Date   Arthritis    Asthma    Iron deficiency anemia 03/23/2020   Past Surgical History:  Procedure Laterality Date   BREAST BIOPSY Right 2010   needle, with Sankar   COLONOSCOPY WITH PROPOFOL N/A 03/25/2020   Procedure: COLONOSCOPY WITH PROPOFOL;  Surgeon: Virgel Manifold, MD;  Location: [facility] END[facility];  Service: Endoscopy;  Laterality: N/A;   ESOPHAGOGASTRODUODENOSCOPY (EGD) WITH PROPOFOL N/A 03/25/2020   Procedure: ESOPHAGOGASTRODUODENOSCOPY (EGD) WITH PROPOFOL;  Surgeon: Virgel Manifold, MD;  Location: [facility] ENDOSCOPY;  Service: Endoscopy;  Laterality: N/A;   ESSURE TUBAL LIGATION  2012   Dr Rayford Halsted   GIVENS CAPSULE STUDY N/A 07/20/2020   Procedure: GIVENS CAPSULE STUDY;  Surgeon: Virgel Manifold, MD;  Location: [facility] ENDOSCOPY;  Service: Endoscopy;  Laterality: N/A;   TUBAL LIGATION     VAGINAL DELIVERY     X2   Family History  Problem Relation Age of Onset   COPD  Father    Cancer Maternal Grandfather        melanoma   Cancer Paternal Grandmother        lung   Breast cancer Paternal Grandmother 13   Colon cancer Neg Hx     Allergies: Patient has no known allergies. Current Outpatient Medications on File Prior to Visit  Medication Sig Dispense Refill   ADVAIR DISKUS 250-50 MCG/ACT AEPB INHALE 1 PUFF BY MOUTH TWICE A DAY 180 each 1   albuterol (VENTOLIN HFA) 108 (90 Base) MCG/ACT inhaler INHALE 2 PUFFS INTO THE LUNGS EVERY 4 HOURS AS NEEDED FOR WHEEZE OR FOR SHORTNESS OF BREATH 8.5 each 2   Biotin 10 MG TABS Take by mouth.     cholecalciferol (VITAMIN D3) 25 MCG (1000 UNIT) tablet Take 1,000 Units by mouth daily.     Multiple Vitamin (MULTIVITAMIN) tablet Take 1 tablet by mouth daily.     vitamin B-12 (CYANOCOBALAMIN) 500 MCG tablet Take 500 mcg by mouth daily.     No current facility-administered medications on file prior to visit.    Social History   Tobacco Use   Smoking status: Former    Packs/day: 0.50    Types: Cigarettes    Quit date: 09/25/2012    Years since quitting: 9.4   Smokeless tobacco: Never  Vaping Use   Vaping Use: Every day  Substance Use Topics  Alcohol use: Yes    Comment: Rarely none last 24hrs   Drug use: No    Review of Systems  Constitutional:  Negative for chills and fever.  Respiratory:  Negative for cough.   Cardiovascular:  Negative for chest pain and palpitations.  Gastrointestinal:  Negative for nausea and vomiting.  Genitourinary:  Negative for pelvic pain and vaginal discharge.  Skin:  Positive for rash.      Objective:    BP 130/76 (BP Location: Right Arm, Patient Position: Sitting, Cuff Size: Large)   Pulse 89   Temp 98.1 F (36.7 C) (Oral)   Wt 236 lb 9.6 oz (107.3 kg)   LMP  (LMP Unknown)   BMI 35.97 kg/m  BP Readings from Last 3 Encounters:  03/09/22 130/76  02/08/21 120/76  12/15/20 102/60   Wt Readings from Last 3 Encounters:  03/09/22 236 lb 9.6 oz (107.3 kg)  02/08/21 205  lb 12.8 oz (93.4 kg)  12/15/20 212 lb 6.4 oz (96.3 kg)    Physical Exam Vitals reviewed.  Constitutional:      Appearance: She is well-developed.  Eyes:     Conjunctiva/sclera: Conjunctivae normal.  Cardiovascular:     Rate and Rhythm: Normal rate and regular rhythm.     Pulses: Normal pulses.     Heart sounds: Normal heart sounds.  Pulmonary:     Effort: Pulmonary effort is normal.     Breath sounds: Normal breath sounds. No wheezing, rhonchi or rales.  Skin:    General: Skin is warm and dry.          Comments: Well circumscribed bilateral inguinal erythema with scaly erythematous border . Scattered non draining 2-3 cm papule suprapubic area and bilateral groin.   Neurological:     Mental Status: She is alert.  Psychiatric:        Speech: Speech normal.        Behavior: Behavior normal.        Thought Content: Thought content normal.        Assessment & Plan:   Problem List Items Addressed This Visit       Musculoskeletal and Integument   Candidal intertrigo - Primary    Due to extensive rash, I prescribed diflucan as may be more beneficial than terbinafine at this time.  I have however refilled terbinafine for her to use for localized infection in the future.  Concern for associated folliculitis, early abscess suprapubic and groin area. Presentation also raises concern for hidradenitis suppurativa. She will start doxycycline.  Encouraged probiotics.  She will have close follow-up with colleague next week as I am out of the office to ensure she is improving.  Referral to dermatology is in place for further evaluation, diagnosis.       Relevant Medications   doxycycline (VIBRA-TABS) 100 MG tablet   terbinafine (LAMISIL) 1 % cream   Other Relevant Orders   Ambulatory referral to Dermatology   Other Visit Diagnoses     Screen for colon cancer       Relevant Orders   Ambulatory referral to Gastroenterology        I am having Alfreda L. Olds start on  fluconazole, doxycycline, and terbinafine. I am also having her maintain her multivitamin, cholecalciferol, Biotin, vitamin B-12, Advair Diskus, and albuterol.   Meds ordered this encounter  Medications   fluconazole (DIFLUCAN) 150 MG tablet    Sig: Take 1 tablet (150 mg total) by mouth once for 1 dose. Take one tablet PO  once. If sxs persist, may take one tablet PO 3 days later.    Dispense:  2 tablet    Refill:  1    Order Specific Question:   Supervising Provider    Answer:   Deborra Medina L [[id number]]   doxycycline (VIBRA-TABS) 100 MG tablet    Sig: Take 1 tablet (100 mg total) by mouth 2 (two) times daily for 10 days.    Dispense:  20 tablet    Refill:  0    Order Specific Question:   Supervising Provider    Answer:   Deborra Medina L [[id number]]   terbinafine (LAMISIL) 1 % cream    Sig: Apply 1 Application topically 2 (two) times daily. Affected area    Dispense:  30 g    Refill:  2    Order Specific Question:   Supervising Provider    Answer:   Crecencio Mc [[id number]]    Return precautions given.   Risks, benefits, and alternatives of the medications and treatment plan prescribed today were discussed, and patient expressed understanding.   Education regarding symptom management and diagnosis given to patient on AVS.  Continue to follow with Burnard Hawthorne, FNP for routine health maintenance.   Hannah Noble and I agreed with plan.   Mable Paris, FNP

## 2022-03-09 NOTE — Patient Instructions (Addendum)
Referral to gastroenterology for colonoscopy and dermatology Let us know if you dont hear back within a week in regards to an appointment being scheduled.   Start Diflucan (antifungal).  Also prescribed antibiotic for concern for abscess.  Ensure to take probiotics while on antibiotics and also for 2 weeks after completion. This can either be by eating yogurt daily or taking a probiotic supplement over the counter such as Culturelle.It is important to re-colonize the gut with good bacteria and also to prevent any diarrheal infections associated with antibiotic use.    For any reason if rash or abscess were to worsen or you develop systemic features including fever, please present to nearest urgent care for in person evaluation over the weekend.  Intertrigo Intertrigo is skin irritation or inflammation (dermatitis) that occurs when folds of skin rub together. The irritation can cause a rash and make skin raw and itchy. This condition most commonly occurs in the skin folds of these areas: Toes. Armpits. Groin. Under the belly. Under the breasts. Buttocks. Intertrigo is not passed from person to person (is not contagious). What are the causes? This condition is caused by heat, moisture, rubbing (friction), and not enough air circulation. The condition can be made worse by: Sweat. Bacteria. A fungus, such as yeast. What increases the risk? This condition is more likely to occur if you have moisture in your skin folds. You are more likely to develop this condition if you: Have diabetes. Are overweight. Are not able to move around or are not active. Live in a warm and moist climate. Wear splints, braces, or other medical devices. Are not able to control your bowels or bladder (have incontinence). What are the signs or symptoms? Symptoms of this condition include: A pink or red skin rash in the skin fold or near the skin fold. Raw or scaly skin. Itchiness. A burning  feeling. Bleeding. Leaking fluid. A bad smell. How is this diagnosed? This condition is diagnosed with a medical history and physical exam. You may also have a skin swab to test for bacteria or a fungus. How is this treated? This condition may be treated by: Cleaning and drying your skin. Taking an antibiotic medicine or using an antibiotic skin cream for a bacterial infection. Using an antifungal cream on your skin or taking pills for an infection that was caused by a fungus, such as yeast. Using a steroid ointment to relieve itchiness and irritation. Separating the skin fold with a clean cotton cloth to absorb moisture and allow air to flow into the area. Follow these instructions at home: Keep the affected area clean and dry. Do not scratch your skin. Stay in a cool environment as much as possible. Use an air conditioner or fan, if available. Apply over-the-counter and prescription medicines only as told by your health care provider. If you were prescribed an antibiotic medicine, use it as told by your health care provider. Do not stop using the antibiotic even if your condition improves. Keep all follow-up visits as told by your health care provider. This is important. How is this prevented?  Maintain a healthy weight. Take care of your feet, especially if you have diabetes. Foot care includes: Wearing shoes that fit well. Keeping your feet dry. Wearing clean, breathable socks. Protect the skin around your groin and buttocks, especially if you have incontinence. Skin protection includes: Following a regular cleaning routine. Using skin protectant creams, powders, or ointments. Changing protection pads frequently. Do not wear tight clothes. Wear clothes that  are loose, absorbent, and made of cotton. Wear a bra that gives good support, if needed. Shower and dry yourself well after activity or exercise. Use a hair dryer on a cool setting to dry between skin folds, especially after  you bathe. If you have diabetes, keep your blood sugar under control. Contact a health care provider if: Your symptoms do not improve with treatment. Your symptoms get worse or they spread. You notice increased redness and warmth. You have a fever. Summary Intertrigo is skin irritation or inflammation (dermatitis) that occurs when folds of skin rub together. This condition is caused by heat, moisture, rubbing (friction), and not enough air circulation. This condition may be treated by cleaning and drying your skin and with medicines. Apply over-the-counter and prescription medicines only as told by your health care provider. Keep all follow-up visits as told by your health care provider. This is important. This information is not intended to replace advice given to you by your health care provider. Make sure you discuss any questions you have with your health care provider. Document Revised: 06/27/2021 Document Reviewed: 06/27/2021 Elsevier Patient Education  [employer].

## 2022-03-10 MED ORDER — TERBINAFINE HCL 1 % EX CREA: 1.0000 | TOPICAL_CREAM | Freq: Two times a day (BID) | CUTANEOUS | 2 refills | Status: DC

## 2022-03-13 ENCOUNTER — Telehealth: Payer: Self-pay

## 2022-03-13 NOTE — Telephone Encounter (Signed)
CALLED PATIENT NO ANSWER LEFT VOICEMAIL FOR A CALL BACK ? ?

## 2022-03-14 ENCOUNTER — Telehealth: Payer: Self-pay

## 2022-03-14 NOTE — Telephone Encounter (Signed)
CALLED PATIENT NO ANSWER LEFT VOICEMAIL FOR A CALL BACK ? ?

## 2022-03-15 ENCOUNTER — Telehealth: Payer: Self-pay

## 2022-03-15 DIAGNOSIS — R7303 Prediabetes: Secondary | ICD-10-CM | POA: Diagnosis not present

## 2022-03-15 NOTE — Telephone Encounter (Signed)
CALLED PATIENT NO ANSWER LEFT VOICEMAIL FOR A CALL BACK °Letter sent °

## 2022-03-16 ENCOUNTER — Ambulatory Visit: Payer: 59 | Admitting: Internal Medicine

## 2022-03-25 ENCOUNTER — Other Ambulatory Visit: Payer: Self-pay | Admitting: Family

## 2022-03-25 DIAGNOSIS — J4520 Mild intermittent asthma, uncomplicated: Secondary | ICD-10-CM

## 2022-04-12 ENCOUNTER — Other Ambulatory Visit (HOSPITAL_COMMUNITY)
Admission: RE | Admit: 2022-04-12 | Discharge: 2022-04-12 | Disposition: A | Discharge status: Home / Self Care | Payer: 59 | Source: Ambulatory Visit | Attending: Family | Admitting: Family

## 2022-04-12 ENCOUNTER — Ambulatory Visit (INDEPENDENT_AMBULATORY_CARE_PROVIDER_SITE_OTHER): Payer: 59 | Admitting: Family

## 2022-04-12 ENCOUNTER — Encounter: Payer: Self-pay | Admitting: Family

## 2022-04-12 VITALS — BP 124/72 | HR 69 | Temp 98.1°F | Ht 69.0 in | Wt 237.4 lb

## 2022-04-12 DIAGNOSIS — L739 Follicular disorder, unspecified: Secondary | ICD-10-CM | POA: Diagnosis not present

## 2022-04-12 DIAGNOSIS — D509 Iron deficiency anemia, unspecified: Secondary | ICD-10-CM

## 2022-04-12 DIAGNOSIS — Z0000 Encounter for general adult medical examination without abnormal findings: Secondary | ICD-10-CM | POA: Diagnosis not present

## 2022-04-12 DIAGNOSIS — N946 Dysmenorrhea, unspecified: Secondary | ICD-10-CM

## 2022-04-12 DIAGNOSIS — B372 Candidiasis of skin and nail: Secondary | ICD-10-CM | POA: Diagnosis not present

## 2022-04-12 MED ORDER — FLUCONAZOLE 150 MG PO TABS: 150.0000 mg | ORAL_TABLET | Freq: Once | ORAL | 2 refills | Status: DC

## 2022-04-12 MED ORDER — MUPIROCIN 2 % EX OINT: 1.0000 | TOPICAL_OINTMENT | Freq: Two times a day (BID) | CUTANEOUS | 2 refills | Status: DC

## 2022-04-12 NOTE — Assessment & Plan Note (Addendum)
Declines pelvic US to evaluate for endometrial abnormalities, fibroids. She prefers to monitor symptom and will let me know if symptom persists.

## 2022-04-12 NOTE — Patient Instructions (Addendum)
Refill for Bactroban, Diflucan.  Please let me know if rash were to worsen,become recurrent as I like to see you in person and be sure medications are effective   referral for colonoscopy. You may call [facility] in regards to scheduling as well: [phone]   Let us know if you dont hear back within a week in regards to an appointment being scheduled.   Health Maintenance, Female Adopting a healthy lifestyle and getting preventive care are important in promoting health and wellness. Ask your health care provider about: The right schedule for you to have regular tests and exams. Things you can do on your own to prevent diseases and keep yourself healthy. What should I know about diet, weight, and exercise? Eat a healthy diet  Eat a diet that includes plenty of vegetables, fruits, low-fat dairy products, and lean protein. Do not eat a lot of foods that are high in solid fats, added sugars, or sodium. Maintain a healthy weight Body mass index (BMI) is used to identify weight problems. It estimates body fat based on height and weight. Your health care provider can help determine your BMI and help you achieve or maintain a healthy weight. Get regular exercise Get regular exercise. This is one of the most important things you can do for your health. Most adults should: Exercise for at least 150 minutes each week. The exercise should increase your heart rate and make you sweat (moderate-intensity exercise). Do strengthening exercises at least twice a week. This is in addition to the moderate-intensity exercise. Spend less time sitting. Even light physical activity can be beneficial. Watch cholesterol and blood lipids Have your blood tested for lipids and cholesterol at 49 years of age, then have this test every 5 years. Have your cholesterol levels checked more often if: Your lipid or cholesterol levels are high. You are older than 49 years of age. You are at high risk for heart  disease. What should I know about cancer screening? Depending on your health history and family history, you may need to have cancer screening at various ages. This may include screening for: Breast cancer. Cervical cancer. Colorectal cancer. Skin cancer. Lung cancer. What should I know about heart disease, diabetes, and high blood pressure? Blood pressure and heart disease High blood pressure causes heart disease and increases the risk of stroke. This is more likely to develop in people who have high blood pressure readings or are overweight. Have your blood pressure checked: Every 3-5 years if you are 66-77 years of age. Every year if you are 71 years old or older. Diabetes Have regular diabetes screenings. This checks your fasting blood sugar level. Have the screening done: Once every three years after age 33 if you are at a normal weight and have a low risk for diabetes. More often and at a younger age if you are overweight or have a high risk for diabetes. What should I know about preventing infection? Hepatitis B If you have a higher risk for hepatitis B, you should be screened for this virus. Talk with your health care provider to find out if you are at risk for hepatitis B infection. Hepatitis C Testing is recommended for: Everyone born from [birth year] through 1963. Anyone with known risk factors for hepatitis C. Sexually transmitted infections (STIs) Get screened for STIs, including gonorrhea and chlamydia, if: You are sexually active and are younger than 49 years of age. You are older than 49 years of age and your health care provider  tells you that you are at risk for this type of infection. Your sexual activity has changed since you were last screened, and you are at increased risk for chlamydia or gonorrhea. Ask your health care provider if you are at risk. Ask your health care provider about whether you are at high risk for HIV. Your health care provider may recommend a  prescription medicine to help prevent HIV infection. If you choose to take medicine to prevent HIV, you should first get tested for HIV. You should then be tested every 3 months for as long as you are taking the medicine. Pregnancy If you are about to stop having your period (premenopausal) and you may become pregnant, seek counseling before you get pregnant. Take 400 to 800 micrograms (mcg) of folic acid every day if you become pregnant. Ask for birth control (contraception) if you want to prevent pregnancy. Osteoporosis and menopause Osteoporosis is a disease in which the bones lose minerals and strength with aging. This can result in bone fractures. If you are 67 years old or older, or if you are at risk for osteoporosis and fractures, ask your health care provider if you should: Be screened for bone loss. Take a calcium or vitamin D supplement to lower your risk of fractures. Be given hormone replacement therapy (HRT) to treat symptoms of menopause. Follow these instructions at home: Alcohol use Do not drink alcohol if: Your health care provider tells you not to drink. You are pregnant, may be pregnant, or are planning to become pregnant. If you drink alcohol: Limit how much you have to: 0-1 drink a day. Know how much alcohol is in your drink. In the [place] one drink equals one 12 oz bottle of beer (355 mL), one 5 oz glass of wine (148 mL), or one 1 oz glass of hard liquor (44 mL). Lifestyle Do not use any products that contain nicotine or tobacco. These products include cigarettes, chewing tobacco, and vaping devices, such as e-cigarettes. If you need help quitting, ask your health care provider. Do not use street drugs. Do not share needles. Ask your health care provider for help if you need support or information about quitting drugs. General instructions Schedule regular health, dental, and eye exams. Stay current with your vaccines. Tell your health care provider if: You often  feel depressed. You have ever been abused or do not feel safe at home. Summary Adopting a healthy lifestyle and getting preventive care are important in promoting health and wellness. Follow your health care provider's instructions about healthy diet, exercising, and getting tested or screened for diseases. Follow your health care provider's instructions on monitoring your cholesterol and blood pressure. This information is not intended to replace advice given to you by your health care provider. Make sure you discuss any questions you have with your health care provider. Document Revised: 01/31/2021 Document Reviewed: 01/31/2021 Elsevier Patient Education  [place].

## 2022-04-12 NOTE — Progress Notes (Signed)
Subjective:    Patient ID: Hannah Noble, female    DOB: 22-May-1971, 51 y.o.   MRN: [id number]  CC: Hannah Noble is a 51 y.o. female who presents today for physical exam.    HPI: She would like to keep prescription of bactroban for small bumps under axilla.  She has occasional red, itching rash in her groin due to sweat.   Diflucan was very helpful and she would also like a refill to keep on hand  Colorectal Cancer Screening: due Breast Cancer Screening: Mammogram UTD Cervical Cancer Screening: due. She continues to have menstrual  cycle.  Occasional large clots the size of quarter on day 2 of menses. She has change pad every couple of hours.  Menses are shorter, occur 4-5 weeks. She has pelvic cramping during menses.   No h/o uterine fibroids, endometriosis.         Lung Cancer Screening: Doesn't have 20 year pack year history and age > 70 years yo 72 years          Tetanus - UTD         Labs: Screening labs today. Exercise: Gets regular exercise.   Alcohol use:  occasional Smoking/tobacco use: smoker.     HISTORY:  Past Medical History:  Diagnosis Date   Arthritis    Asthma    Iron deficiency anemia 03/23/2020    Past Surgical History:  Procedure Laterality Date   BREAST BIOPSY Right 2010   needle, with Sankar   COLONOSCOPY WITH PROPOFOL N/A 03/25/2020   Procedure: COLONOSCOPY WITH PROPOFOL;  Surgeon: Virgel Manifold, MD;  Location: [facility];  Service: Endoscopy;  Laterality: N/A;   ESOPHAGOGASTRODUODENOSCOPY (EGD) WITH PROPOFOL N/A 03/25/2020   Procedure: ESOPHAGOGASTRODUODENOSCOPY (EGD) WITH PROPOFOL;  Surgeon: Virgel Manifold, MD;  Location: [facility];  Service: Endoscopy;  Laterality: N/A;   ESSURE TUBAL LIGATION  2012   Dr Rayford Halsted   GIVENS CAPSULE STUDY N/A 07/20/2020   Procedure: GIVENS CAPSULE STUDY;  Surgeon: Virgel Manifold, MD;  Location: [facility];  Service: Endoscopy;  Laterality: N/A;   TUBAL LIGATION      VAGINAL DELIVERY     X2   Family History  Problem Relation Age of Onset   COPD Father    Cancer Maternal Grandfather        melanoma   Cancer Paternal Grandmother        lung   Breast cancer Paternal Grandmother 49   Colon cancer Neg Hx       ALLERGIES: Patient has no known allergies.  Current Outpatient Medications on File Prior to Visit  Medication Sig Dispense Refill   ADVAIR DISKUS 250-50 MCG/ACT AEPB INHALE 1 PUFF BY MOUTH TWICE A DAY 180 each 1   albuterol (VENTOLIN HFA) 108 (90 Base) MCG/ACT inhaler INHALE 2 PUFFS INTO THE LUNGS EVERY 4 HOURS AS NEEDED FOR WHEEZE OR FOR SHORTNESS OF BREATH 8.5 each 2   Biotin 10 MG TABS Take by mouth.     cholecalciferol (VITAMIN D3) 25 MCG (1000 UNIT) tablet Take 1,000 Units by mouth daily.     Multiple Vitamin (MULTIVITAMIN) tablet Take 1 tablet by mouth daily.     terbinafine (LAMISIL) 1 % cream Apply 1 Application topically 2 (two) times daily. Affected area 30 g 2   vitamin B-12 (CYANOCOBALAMIN) 500 MCG tablet Take 500 mcg by mouth daily.     No current facility-administered medications on file prior to visit.    Social History   Tobacco  Use   Smoking status: Former    Packs/day: 0.50    Types: Cigarettes    Quit date: 09/25/2012    Years since quitting: 9.5   Smokeless tobacco: Never  Vaping Use   Vaping Use: Every day  Substance Use Topics   Alcohol use: Yes    Comment: Rarely none last 24hrs   Drug use: No    Review of Systems  Constitutional:  Negative for chills, fever and unexpected weight change.  HENT:  Negative for congestion.   Respiratory:  Negative for cough.   Cardiovascular:  Negative for chest pain, palpitations and leg swelling.  Gastrointestinal:  Negative for nausea and vomiting.  Genitourinary:  Positive for menstrual problem. Negative for pelvic pain.  Musculoskeletal:  Negative for arthralgias and myalgias.  Skin:  Positive for rash.  Neurological:  Negative for headaches.  Hematological:   Negative for adenopathy.  Psychiatric/Behavioral:  Negative for confusion.       Objective:    BP 124/72 (BP Location: Right Arm, Patient Position: Sitting, Cuff Size: Large)   Pulse 69   Temp 98.1 F (36.7 C) (Oral)   Ht '5\' 9"'$  (1.753 m)   Wt 237 lb 6.4 oz (107.7 kg)   LMP 03/28/2022 (Exact Date)   SpO2 98%   BMI 35.06 kg/m   BP Readings from Last 3 Encounters:  04/12/22 124/72  03/09/22 130/76  02/08/21 120/76   Wt Readings from Last 3 Encounters:  04/12/22 237 lb 6.4 oz (107.7 kg)  03/09/22 236 lb 9.6 oz (107.3 kg)  02/08/21 205 lb 12.8 oz (93.4 kg)    Physical Exam Vitals reviewed.  Constitutional:      Appearance: Normal appearance. She is well-developed.  Eyes:     Conjunctiva/sclera: Conjunctivae normal.  Neck:     Thyroid: No thyroid mass or thyromegaly.  Cardiovascular:     Rate and Rhythm: Normal rate and regular rhythm.     Pulses: Normal pulses.     Heart sounds: Normal heart sounds.  Pulmonary:     Effort: Pulmonary effort is normal.     Breath sounds: Normal breath sounds. No wheezing, rhonchi or rales.  Chest:  Breasts:    Breasts are symmetrical.     Right: No inverted nipple, mass, nipple discharge, skin change or tenderness.     Left: No inverted nipple, mass, nipple discharge, skin change or tenderness.  Abdominal:     General: Bowel sounds are normal. There is no distension.     Palpations: Abdomen is soft. Abdomen is not rigid. There is no fluid wave or mass.     Tenderness: There is no abdominal tenderness. There is no guarding or rebound.  Genitourinary:    Cervix: No cervical motion tenderness, discharge or friability.     Uterus: Not enlarged, not fixed and not tender.      Adnexa:        Right: No mass, tenderness or fullness.         Left: No mass, tenderness or fullness.       Comments: Pap performed. No CMT. Unable to appreciated ovaries. Lymphadenopathy:     Head:     Right side of head: No submental, submandibular, tonsillar,  preauricular, posterior auricular or occipital adenopathy.     Left side of head: No submental, submandibular, tonsillar, preauricular, posterior auricular or occipital adenopathy.     Cervical:     Right cervical: No superficial, deep or posterior cervical adenopathy.    Left cervical: No superficial,  deep or posterior cervical adenopathy.     Upper Body:     Right upper body: No pectoral adenopathy.     Left upper body: No pectoral adenopathy.  Skin:    General: Skin is warm and dry.  Neurological:     Mental Status: She is alert.  Psychiatric:        Speech: Speech normal.        Behavior: Behavior normal.        Thought Content: Thought content normal.        Assessment & Plan:   Problem List Items Addressed This Visit       Musculoskeletal and Integument   Candidal intertrigo    Chronic, stable. Refilled diflucan for candida. She has upcoming dermatology appointment.       Relevant Medications   mupirocin ointment (BACTROBAN) 2 %   Folliculitis    Chronic, Refilled bactroban for folliculitis      Relevant Medications   mupirocin ointment (BACTROBAN) 2 %     Genitourinary   Dysmenorrhea    Declines pelvic US to evaluate for endometrial abnormalities, fibroids. She prefers to monitor symptom and will let me know if symptom persists.         Other   Iron deficiency anemia   Relevant Orders   B12 and Folate Panel   IBC + Ferritin   Routine general medical examination at a health care facility - Primary    Clinical breast exam performed today.  Pap smear performed.      Relevant Orders   Ambulatory referral to Gastroenterology   Cytology - PAP (Completed)   CBC with Differential/Platelet   Comprehensive metabolic panel   Hemoglobin A1c   Lipid panel   TSH   VITAMIN D 25 Hydroxy (Vit-D Deficiency, Fractures)     I am having Noor L. Tetreault start on mupirocin ointment and fluconazole. I am also having her maintain her multivitamin, cholecalciferol,  Biotin, cyanocobalamin, Advair Diskus, terbinafine, and albuterol.   Meds ordered this encounter  Medications   mupirocin ointment (BACTROBAN) 2 %    Sig: Apply 1 Application topically 2 (two) times daily.    Dispense:  22 g    Refill:  2    Order Specific Question:   Supervising Provider    Answer:   Deborra Medina L [[id number]]   fluconazole (DIFLUCAN) 150 MG tablet    Sig: Take 1 tablet (150 mg total) by mouth once for 1 dose. Take one tablet PO once. If sxs persist, may take one tablet PO 3 days later.    Dispense:  2 tablet    Refill:  2    Order Specific Question:   Supervising Provider    Answer:   Crecencio Mc [[id number]]    Return precautions given.   Risks, benefits, and alternatives of the medications and treatment plan prescribed today were discussed, and patient expressed understanding.   Education regarding symptom management and diagnosis given to patient on AVS.   Continue to follow with Burnard Hawthorne, FNP for routine health maintenance.   Hannah Noble and I agreed with plan.   Mable Paris, FNP

## 2022-04-13 LAB — CYTOLOGY - PAP
Comment: NEGATIVE
Diagnosis: NEGATIVE
High risk HPV: NEGATIVE

## 2022-04-14 DIAGNOSIS — R7303 Prediabetes: Secondary | ICD-10-CM | POA: Diagnosis not present

## 2022-04-14 NOTE — Assessment & Plan Note (Addendum)
Chronic, stable. Refilled diflucan for candida. She has upcoming dermatology appointment.

## 2022-04-14 NOTE — Assessment & Plan Note (Signed)
Clinical breast exam performed today.  Pap smear performed.

## 2022-04-14 NOTE — Assessment & Plan Note (Signed)
Chronic, Refilled bactroban for folliculitis

## 2022-05-07 ENCOUNTER — Other Ambulatory Visit: Payer: Self-pay | Admitting: Family

## 2022-05-07 DIAGNOSIS — J4520 Mild intermittent asthma, uncomplicated: Secondary | ICD-10-CM

## 2022-05-11 ENCOUNTER — Other Ambulatory Visit (INDEPENDENT_AMBULATORY_CARE_PROVIDER_SITE_OTHER): Payer: 59

## 2022-05-11 DIAGNOSIS — D509 Iron deficiency anemia, unspecified: Secondary | ICD-10-CM | POA: Diagnosis not present

## 2022-05-11 DIAGNOSIS — Z0000 Encounter for general adult medical examination without abnormal findings: Secondary | ICD-10-CM

## 2022-05-11 LAB — COMPREHENSIVE METABOLIC PANEL
ALT: 15 U/L (ref 0–35)
AST: 14 U/L (ref 0–37)
Albumin: 4.2 g/dL (ref 3.5–5.2)
Alkaline Phosphatase: 39 U/L (ref 39–117)
BUN: 11 mg/dL (ref 6–23)
CO2: 27 mEq/L (ref 19–32)
Calcium: 8.9 mg/dL (ref 8.4–10.5)
Chloride: 104 mEq/L (ref 96–112)
Creatinine, Ser: 0.74 mg/dL (ref 0.40–1.20)
GFR: 95.33 mL/min (ref >60.00)
Glucose, Bld: 94 mg/dL (ref 70–99)
Potassium: 4.3 mEq/L (ref 3.5–5.1)
Sodium: 139 mEq/L (ref 135–145)
Total Bilirubin: 0.4 mg/dL (ref 0.2–1.2)
Total Protein: 6.9 g/dL (ref 6.0–8.3)

## 2022-05-11 LAB — CBC WITH DIFFERENTIAL/PLATELET
Basophils Absolute: 0.1 10*3/uL (ref 0.0–0.1)
Basophils Relative: 1 % (ref 0.0–3.0)
Eosinophils Absolute: 0.3 10*3/uL (ref 0.0–0.7)
Eosinophils Relative: 4.5 % (ref 0.0–5.0)
HCT: 38.5 % (ref 36.0–46.0)
Hemoglobin: 12.9 g/dL (ref 12.0–15.0)
Lymphocytes Relative: 19.8 % (ref 12.0–46.0)
Lymphs Abs: 1.2 10*3/uL (ref 0.7–4.0)
MCHC: 33.6 g/dL (ref 30.0–36.0)
MCV: 90.6 fl (ref 78.0–100.0)
Monocytes Absolute: 0.3 10*3/uL (ref 0.1–1.0)
Monocytes Relative: 5.4 % (ref 3.0–12.0)
Neutro Abs: 4.3 10*3/uL (ref 1.4–7.7)
Neutrophils Relative %: 69.3 % (ref 43.0–77.0)
Platelets: 284 10*3/uL (ref 150.0–400.0)
RBC: 4.25 Mil/uL (ref 3.87–5.11)
RDW: 13.3 % (ref 11.5–15.5)
WBC: 6.2 10*3/uL (ref 4.0–10.5)

## 2022-05-11 LAB — IBC + FERRITIN
Ferritin: 20.9 ng/mL (ref 10.0–291.0)
Iron: 50 ug/dL (ref 42–145)
Saturation Ratios: 11.9 % — ABNORMAL LOW (ref 20.0–50.0)
TIBC: 421.4 ug/dL (ref 250.0–450.0)
Transferrin: 301 mg/dL (ref 212.0–360.0)

## 2022-05-11 LAB — B12 AND FOLATE PANEL
Folate: 22.6 ng/mL (ref >5.9)
Vitamin B-12: 1008 pg/mL — ABNORMAL HIGH (ref 211–911)

## 2022-05-11 LAB — TSH: TSH: 1.75 u[IU]/mL (ref 0.35–5.50)

## 2022-05-11 LAB — LIPID PANEL
Cholesterol: 192 mg/dL (ref 0–200)
HDL: 80.2 mg/dL (ref >39.00)
LDL Cholesterol: 99 mg/dL (ref 0–99)
NonHDL: 111.77
Total CHOL/HDL Ratio: 2
Triglycerides: 64 mg/dL (ref 0.0–149.0)
VLDL: 12.8 mg/dL (ref 0.0–40.0)

## 2022-05-11 LAB — HEMOGLOBIN A1C: Hgb A1c MFr Bld: 5.7 % (ref 4.6–6.5)

## 2022-05-11 LAB — VITAMIN D 25 HYDROXY (VIT D DEFICIENCY, FRACTURES): VITD: 31.8 ng/mL (ref 30.00–100.00)

## 2022-05-15 DIAGNOSIS — R7303 Prediabetes: Secondary | ICD-10-CM | POA: Diagnosis not present

## 2022-05-22 ENCOUNTER — Telehealth: Payer: Self-pay

## 2022-05-22 DIAGNOSIS — J4520 Mild intermittent asthma, uncomplicated: Secondary | ICD-10-CM

## 2022-05-22 MED ORDER — FLUTICASONE-SALMETEROL 250-50 MCG/ACT IN AEPB: INHALATION_SPRAY | RESPIRATORY_TRACT | 3 refills | Status: DC

## 2022-05-22 NOTE — Telephone Encounter (Signed)
Spoke to patient and informed her that her Advair had been refilled

## 2022-05-22 NOTE — Telephone Encounter (Signed)
Call pt  Advair refilled

## 2022-05-22 NOTE — Telephone Encounter (Signed)
Patient called her pharmacy they told her that her Advair was rejected by her provider.

## 2022-05-22 NOTE — Telephone Encounter (Signed)
Patient states she requested a refill for her ADVAIR DISKUS 250-50 MCG/ACT AEPB from her pharmacy on 05/04/2022, and her pharmacy stated they are waiting to hear from [place].  Patient states she will call her pharmacy on her lunch break and ask them to refax the information, just in case we did not receive it the first time.  Patient states she is out of this medication.  *[facility] [address] in [place] (not in [employer]).

## 2022-05-25 ENCOUNTER — Telehealth: Payer: Self-pay

## 2022-05-25 ENCOUNTER — Other Ambulatory Visit: Payer: Self-pay

## 2022-05-25 DIAGNOSIS — Z1211 Encounter for screening for malignant neoplasm of colon: Secondary | ICD-10-CM

## 2022-05-25 MED ORDER — NA SULFATE-K SULFATE-MG SULF 17.5-3.13-1.6 GM/177ML PO SOLN: 2.0000 | Freq: Every day | ORAL | 0 refills | Status: AC

## 2022-05-25 NOTE — Telephone Encounter (Signed)
Gastroenterology Pre-Procedure Review  Request Date: 10/09 Requesting Physician: Dr. Marius Ditch  PATIENT REVIEW QUESTIONS: The patient responded to the following health history questions as indicated:    1. Are you having any GI issues? no 2. Do you have a personal history of Polyps? no,  last colonoscopy performed by Dr. Bonna Gains 03/25/20 no polyps were noted.  Recommended on colonoscopy report to have repeat colonoscopy in 1 year with a two day prep  3. Do you have a family history of Colon Cancer or Polyps? no 4. Diabetes Mellitus? no 5. Joint replacements in the past 12 months?no 6. Major health problems in the past 3 months?no 7. Any artificial heart valves, MVP, or defibrillator?no    MEDICATIONS & ALLERGIES:    Patient reports the following regarding taking any anticoagulation/antiplatelet therapy:   Plavix, Coumadin, Eliquis, Xarelto, Lovenox, Pradaxa, Brilinta, or Effient? no Aspirin? no  Patient confirms/reports the following medications:  Current Outpatient Medications  Medication Sig Dispense Refill   albuterol (VENTOLIN HFA) 108 (90 Base) MCG/ACT inhaler INHALE 2 PUFFS INTO THE LUNGS EVERY 4 HOURS AS NEEDED FOR WHEEZE OR FOR SHORTNESS OF BREATH 8.5 each 2   Biotin 10 MG TABS Take by mouth.     cholecalciferol (VITAMIN D3) 25 MCG (1000 UNIT) tablet Take 1,000 Units by mouth daily.     fluticasone-salmeterol (ADVAIR DISKUS) 250-50 MCG/ACT AEPB INHALE 1 PUFF BY MOUTH TWICE A DAY 180 each 3   Multiple Vitamin (MULTIVITAMIN) tablet Take 1 tablet by mouth daily.     mupirocin ointment (BACTROBAN) 2 % Apply 1 Application topically 2 (two) times daily. 22 g 2   terbinafine (LAMISIL) 1 % cream Apply 1 Application topically 2 (two) times daily. Affected area 30 g 2   vitamin B-12 (CYANOCOBALAMIN) 500 MCG tablet Take 500 mcg by mouth daily.     No current facility-administered medications for this visit.    Patient confirms/reports the following allergies:  No Known Allergies  No  orders of the defined types were placed in this encounter.   AUTHORIZATION INFORMATION Primary Insurance: 1D#: Group #:  Secondary Insurance: 1D#: Group #:  SCHEDULE INFORMATION: Date: 07/03/22 Time: Location: [facility]

## 2022-05-30 ENCOUNTER — Telehealth: Payer: Self-pay | Admitting: Family

## 2022-05-30 ENCOUNTER — Telehealth: Payer: Self-pay

## 2022-05-30 ENCOUNTER — Other Ambulatory Visit: Payer: Self-pay

## 2022-05-30 DIAGNOSIS — J4520 Mild intermittent asthma, uncomplicated: Secondary | ICD-10-CM

## 2022-05-30 MED ORDER — ALBUTEROL SULFATE HFA 108 (90 BASE) MCG/ACT IN AERS: INHALATION_SPRAY | RESPIRATORY_TRACT | 2 refills | Status: DC

## 2022-05-30 NOTE — Telephone Encounter (Signed)
Patient states she needs a refill for her albuterol (VENTOLIN HFA) 108 (90 Base) MCG/ACT inhaler.  *[facility] [address] ([facility] [employer]

## 2022-05-30 NOTE — Telephone Encounter (Signed)
[facility]

## 2022-05-30 NOTE — Telephone Encounter (Signed)
Pt need a refill on albuterol sent to [facility]

## 2022-05-30 NOTE — Telephone Encounter (Signed)
Refill sent ...lvm to inform patient

## 2022-06-09 ENCOUNTER — Other Ambulatory Visit: Payer: Self-pay | Admitting: Family

## 2022-06-09 DIAGNOSIS — B372 Candidiasis of skin and nail: Secondary | ICD-10-CM

## 2022-06-13 NOTE — Telephone Encounter (Signed)
Please advise to refill.

## 2022-06-14 ENCOUNTER — Other Ambulatory Visit: Payer: Self-pay | Admitting: Family

## 2022-06-14 DIAGNOSIS — B372 Candidiasis of skin and nail: Secondary | ICD-10-CM

## 2022-06-15 DIAGNOSIS — R7303 Prediabetes: Secondary | ICD-10-CM | POA: Diagnosis not present

## 2022-07-03 ENCOUNTER — Ambulatory Visit: Payer: 59 | Admitting: Anesthesiology

## 2022-07-03 ENCOUNTER — Encounter
Admission: RE | Disposition: A | Discharge status: Home / Self Care | Payer: Self-pay | Source: Home / Self Care | Attending: Gastroenterology

## 2022-07-03 ENCOUNTER — Ambulatory Visit
Admission: RE | Admit: 2022-07-03 | Discharge: 2022-07-03 | Disposition: A | Discharge status: Home / Self Care | Payer: 59 | Attending: Gastroenterology | Admitting: Gastroenterology

## 2022-07-03 DIAGNOSIS — K635 Polyp of colon: Secondary | ICD-10-CM

## 2022-07-03 DIAGNOSIS — Z1211 Encounter for screening for malignant neoplasm of colon: Secondary | ICD-10-CM | POA: Insufficient documentation

## 2022-07-03 DIAGNOSIS — Z87891 Personal history of nicotine dependence: Secondary | ICD-10-CM | POA: Insufficient documentation

## 2022-07-03 DIAGNOSIS — R69 Illness, unspecified: Secondary | ICD-10-CM | POA: Diagnosis not present

## 2022-07-03 DIAGNOSIS — D124 Benign neoplasm of descending colon: Secondary | ICD-10-CM | POA: Diagnosis not present

## 2022-07-03 DIAGNOSIS — M1990 Unspecified osteoarthritis, unspecified site: Secondary | ICD-10-CM | POA: Insufficient documentation

## 2022-07-03 DIAGNOSIS — K644 Residual hemorrhoidal skin tags: Secondary | ICD-10-CM | POA: Insufficient documentation

## 2022-07-03 DIAGNOSIS — Z6836 Body mass index (BMI) 36.0-36.9, adult: Secondary | ICD-10-CM | POA: Insufficient documentation

## 2022-07-03 DIAGNOSIS — J45909 Unspecified asthma, uncomplicated: Secondary | ICD-10-CM | POA: Diagnosis not present

## 2022-07-03 HISTORY — PX: COLONOSCOPY WITH PROPOFOL: SHX5780

## 2022-07-03 LAB — POCT PREGNANCY, URINE: Preg Test, Ur: NEGATIVE

## 2022-07-03 SURGERY — COLONOSCOPY WITH PROPOFOL
Anesthesia: General

## 2022-07-03 MED ORDER — SODIUM CHLORIDE 0.9 % IV SOLN
INTRAVENOUS | Status: DC
  Administered 2022-07-03: 20 mL/h via INTRAVENOUS

## 2022-07-03 MED ORDER — LIDOCAINE HCL (CARDIAC) PF 100 MG/5ML IV SOSY
PREFILLED_SYRINGE | INTRAVENOUS | Status: DC | PRN
  Administered 2022-07-03: 40 mg via INTRAVENOUS

## 2022-07-03 MED ORDER — PROPOFOL 10 MG/ML IV BOLUS
INTRAVENOUS | Status: DC | PRN
  Administered 2022-07-03: 50 mg via INTRAVENOUS
  Administered 2022-07-03: 120 mg via INTRAVENOUS
  Administered 2022-07-03 (×6): 50 mg via INTRAVENOUS

## 2022-07-03 MED ORDER — STERILE WATER FOR IRRIGATION IR SOLN
Status: DC | PRN
  Administered 2022-07-03: 60 mL

## 2022-07-03 MED ORDER — PHENYLEPHRINE 80 MCG/ML (10ML) SYRINGE FOR IV PUSH (FOR BLOOD PRESSURE SUPPORT)
PREFILLED_SYRINGE | INTRAVENOUS | Status: DC | PRN
  Administered 2022-07-03: 80 ug via INTRAVENOUS

## 2022-07-03 NOTE — Anesthesia Preprocedure Evaluation (Addendum)
Anesthesia Evaluation  Patient identified by MRN, date of birth, ID band Patient awake    Reviewed: Allergy & Precautions, NPO status , Patient's Chart, lab work & pertinent test results  Airway Mallampati: II  TM Distance: >3 FB Neck ROM: Full    Dental  (+) Teeth Intact   Pulmonary neg pulmonary ROS, asthma , Patient abstained from smoking., former smoker,    Pulmonary exam normal  + decreased breath sounds      Cardiovascular Exercise Tolerance: Good negative cardio ROS Normal cardiovascular exam Rhythm:Regular     Neuro/Psych negative neurological ROS  negative psych ROS   GI/Hepatic negative GI ROS, Neg liver ROS,   Endo/Other  negative endocrine ROSMorbid obesity  Renal/GU negative Renal ROS  negative genitourinary   Musculoskeletal   Abdominal (+) + obese,   Peds negative pediatric ROS (+)  Hematology negative hematology ROS (+) Blood dyscrasia, anemia ,   Anesthesia Other Findings Past Medical History: No date: Arthritis No date: Asthma 03/23/2020: Iron deficiency anemia  Past Surgical History: 2010: BREAST BIOPSY; Right     Comment:  needle, with Sankar 03/25/2020: COLONOSCOPY WITH PROPOFOL; N/A     Comment:  Procedure: COLONOSCOPY WITH PROPOFOL;  Surgeon:               Virgel Manifold, MD;  Location: [facility];                Service: Endoscopy;  Laterality: N/A; 03/25/2020: ESOPHAGOGASTRODUODENOSCOPY (EGD) WITH PROPOFOL; N/A     Comment:  Procedure: ESOPHAGOGASTRODUODENOSCOPY (EGD) WITH               PROPOFOL;  Surgeon: Virgel Manifold, MD;  Location:               [facility];  Service: Endoscopy;  Laterality: N/A; 2012: ESSURE TUBAL LIGATION     Comment:  Dr Rayford Halsted 07/20/2020: GIVENS CAPSULE STUDY; N/A     Comment:  Procedure: GIVENS CAPSULE STUDY;  Surgeon: Virgel Manifold, MD;  Location: [facility];  Service:               Endoscopy;  Laterality:  N/A; No date: TUBAL LIGATION No date: VAGINAL DELIVERY     Comment:  X2  BMI    Body Mass Index: 36.18 kg/m      Reproductive/Obstetrics negative OB ROS                            Anesthesia Physical Anesthesia Plan  ASA: 3  Anesthesia Plan: General   Post-op Pain Management:    Induction: Intravenous  PONV Risk Score and Plan: Propofol infusion and TIVA  Airway Management Planned: Natural Airway  Additional Equipment:   Intra-op Plan:   Post-operative Plan:   Informed Consent: I have reviewed the patients History and Physical, chart, labs and discussed the procedure including the risks, benefits and alternatives for the proposed anesthesia with the patient or authorized representative who has indicated his/her understanding and acceptance.     Dental Advisory Given  Plan Discussed with: CRNA and Surgeon  Anesthesia Plan Comments:         Anesthesia Quick Evaluation

## 2022-07-03 NOTE — Op Note (Signed)
[facility]astroenterology Patient Name: Hannah Noble Procedure Date: 07/03/2022 8:58 AM MRN: [id number] Account #: [id number] Date of Birth: 06-11-1971 Admit Type: Outpatient Age: 51 Room: [facility] ENDO ROOM 4 Gender: Female Note Status: Finalized Instrument Name: Jasper Riling [id number] Procedure:             Colonoscopy Indications:           Screening for colorectal malignant neoplasm, Screening                         for colorectal malignant neoplasm, inadequate bowel                         prep on last colonoscopy (more recent than 10 years                         ago), Last colonoscopy: July 2021 Providers:             Lin Landsman MD, MD Referring MD:          Yvetta Coder. Arnett (Referring MD) Medicines:             General Anesthesia Complications:         No immediate complications. Estimated blood loss: None. Procedure:             Pre-Anesthesia Assessment:                        - Prior to the procedure, a History and Physical was                         performed, and patient medications and allergies were                         reviewed. The patient is competent. The risks and                         benefits of the procedure and the sedation options and                         risks were discussed with the patient. All questions                         were answered and informed consent was obtained.                         Patient identification and proposed procedure were                         verified by the physician, the nurse, the                         anesthesiologist, the anesthetist and the technician                         in the pre-procedure area in the procedure room in the                         endoscopy suite. Mental Status Examination: alert and  oriented. Airway Examination: normal oropharyngeal                         airway and neck mobility. Respiratory Examination:                          clear to auscultation. CV Examination: normal.                         Prophylactic Antibiotics: The patient does not require                         prophylactic antibiotics. Prior Anticoagulants: The                         patient has taken no previous anticoagulant or                         antiplatelet agents. ASA Grade Assessment: III - A                         patient with severe systemic disease. After reviewing                         the risks and benefits, the patient was deemed in                         satisfactory condition to undergo the procedure. The                         anesthesia plan was to use general anesthesia.                         Immediately prior to administration of medications,                         the patient was re-assessed for adequacy to receive                         sedatives. The heart rate, respiratory rate, oxygen                         saturations, blood pressure, adequacy of pulmonary                         ventilation, and response to care were monitored                         throughout the procedure. The physical status of the                         patient was re-assessed after the procedure.                        After obtaining informed consent, the colonoscope was                         passed under direct vision. Throughout the procedure,  the patient's blood pressure, pulse, and oxygen                         saturations were monitored continuously. The                         Colonoscope was introduced through the anus and                         advanced to the the cecum, identified by appendiceal                         orifice and ileocecal valve. The colonoscopy was                         performed without difficulty. The patient tolerated                         the procedure well. The quality of the bowel                         preparation was evaluated using the BBPS [facility] Bowel                          Preparation Scale) with scores of: Right Colon = 3,                         Transverse Colon = 3 and Left Colon = 3 (entire mucosa                         seen well with no residual staining, small fragments                         of stool or opaque liquid). The total BBPS score                         equals 9. Findings:      The perianal and digital rectal examinations were normal. Pertinent       negatives include normal sphincter tone and no palpable rectal lesions.      A 9 mm polyp was found in the descending colon. The polyp was sessile.       The polyp was removed with a cold snare. Resection and retrieval were       complete. Estimated blood loss was minimal. To prevent bleeding after       the polypectomy, two hemostatic clips were successfully placed. There       was no bleeding during, or at the end, of the procedure.      Non-bleeding external hemorrhoids were found during retroflexion. The       hemorrhoids were medium-sized. Impression:            - One 9 mm polyp in the descending colon, removed with                         a cold snare. Resected and retrieved. Clips were  placed.                        - Non-bleeding external hemorrhoids. Recommendation:        - Repeat colonoscopy in 5 years for surveillance.                        - Discharge patient to home (with escort).                        - Resume previous diet today.                        - Await pathology results. Procedure Code(s):     --- Professional ---                        [phone], Colonoscopy, flexible; with removal of                         tumor(s), polyp(s), or other lesion(s) by snare                         technique Diagnosis Code(s):     --- Professional ---                        K63.5, Polyp of colon                        K64.4, Residual hemorrhoidal skin tags                        Z12.11, Encounter for screening for malignant neoplasm                          of colon CPT copyright 2019 American Medical Association. All rights reserved. The codes documented in this report are preliminary and upon coder review may  be revised to meet current compliance requirements. Dr. Ulyess Mort Lin Landsman MD, MD 07/03/2022 9:29:38 AM This report has been signed electronically. Number of Addenda: 0 Note Initiated On: 07/03/2022 8:58 AM Scope Withdrawal Time: 0 hours 12 minutes 16 seconds  Total Procedure Duration: 0 hours 17 minutes 32 seconds  Estimated Blood Loss:  Estimated blood loss: none.      [facility]

## 2022-07-03 NOTE — Anesthesia Postprocedure Evaluation (Signed)
Anesthesia Post Note  Patient: Hannah Noble  Procedure(s) Performed: COLONOSCOPY WITH PROPOFOL  Anesthesia Type: General Anesthetic complications: no   No notable events documented.   Last Vitals:  Vitals:   07/03/22 0930 07/03/22 0940  BP: 109/69 (!) 109/52  Pulse: 62 63  Resp: 14 13  Temp: (!) 36.3 C   SpO2: 99% 100%    Last Pain:  Vitals:   07/03/22 0940  TempSrc:   PainSc: 0-No pain                 VAN STAVEREN,Marquis Diles

## 2022-07-03 NOTE — H&P (Signed)
Cephas Darby, MD [address]  [place]  [place],  [address]  Main: [phone]  Fax: [phone] Pager: [phone]  Primary Care Physician:  Burnard Hawthorne, FNP Primary Gastroenterologist:  Dr. Cephas Darby  Pre-Procedure History & Physical: HPI:  Hannah Noble is a 51 y.o. female is here for an colonoscopy.   Past Medical History:  Diagnosis Date   Arthritis    Asthma    Iron deficiency anemia 03/23/2020    Past Surgical History:  Procedure Laterality Date   BREAST BIOPSY Right 2010   needle, with Sankar   COLONOSCOPY WITH PROPOFOL N/A 03/25/2020   Procedure: COLONOSCOPY WITH PROPOFOL;  Surgeon: Virgel Manifold, MD;  Location: [facility] ENDOSCOPY;  Service: Endoscopy;  Laterality: N/A;   ESOPHAGOGASTRODUODENOSCOPY (EGD) WITH PROPOFOL N/A 03/25/2020   Procedure: ESOPHAGOGASTRODUODENOSCOPY (EGD) WITH PROPOFOL;  Surgeon: Virgel Manifold, MD;  Location: [facility] ENDOSCOPY;  Service: Endoscopy;  Laterality: N/A;   ESSURE TUBAL LIGATION  2012   Dr Rayford Halsted   GIVENS CAPSULE STUDY N/A 07/20/2020   Procedure: GIVENS CAPSULE STUDY;  Surgeon: Virgel Manifold, MD;  Location: [facility] ENDOSCOPY;  Service: Endoscopy;  Laterality: N/A;   TUBAL LIGATION     VAGINAL DELIVERY     X2    Prior to Admission medications   Medication Sig Start Date End Date Taking? Authorizing Provider  albuterol (VENTOLIN HFA) 108 (90 Base) MCG/ACT inhaler INHALE 2 PUFFS INTO THE LUNGS EVERY 4 HOURS AS NEEDED FOR WHEEZE OR FOR SHORTNESS OF BREATH 05/30/22  Yes Burnard Hawthorne, FNP  Biotin 10 MG TABS Take by mouth.   Yes [provider]  cholecalciferol (VITAMIN D3) 25 MCG (1000 UNIT) tablet Take 1,000 Units by mouth daily.   Yes [provider]  fluconazole (DIFLUCAN) 150 MG tablet TAKE 1 TABLET BY MOUTH ONCE FOR 1 DOSE. IF SYMPTOMS PERSIST, MAY TAKE ONE TABLET 3 DAYS LATER. 06/14/22  Yes Burnard Hawthorne, FNP  fluticasone-salmeterol (ADVAIR DISKUS) 250-50  MCG/ACT AEPB INHALE 1 PUFF BY MOUTH TWICE A DAY 05/22/22  Yes Burnard Hawthorne, FNP  Multiple Vitamin (MULTIVITAMIN) tablet Take 1 tablet by mouth daily.   Yes [provider]  mupirocin ointment (BACTROBAN) 2 % Apply 1 Application topically 2 (two) times daily. 04/12/22  Yes Arnett, Yvetta Coder, FNP  terbinafine (LAMISIL) 1 % cream Apply 1 Application topically 2 (two) times daily. Affected area 03/10/22  Yes Arnett, Yvetta Coder, FNP  vitamin B-12 (CYANOCOBALAMIN) 500 MCG tablet Take 500 mcg by mouth daily.   Yes [provider]    Allergies as of 05/25/2022   (No Known Allergies)    Family History  Problem Relation Age of Onset   COPD Father    Cancer Maternal Grandfather        melanoma   Cancer Paternal Grandmother        lung   Breast cancer Paternal Grandmother 74   Colon cancer Neg Hx     Social History   Socioeconomic History   Marital status: Divorced    Spouse name: Not on file   Number of children: 2   Years of education: Not on file   Highest education level: Not on file  Occupational History    Employer: [employer]  Tobacco Use   Smoking status: Former    Packs/day: 0.50    Types: Cigarettes    Quit date: 09/25/2012    Years since quitting: 9.7   Smokeless tobacco: Never  Vaping Use  Vaping Use: Every day  Substance and Sexual Activity   Alcohol use: Yes    Comment: Rarely none last 24hrs   Drug use: No   Sexual activity: Not on file  Other Topics Concern   Not on file  Social History Narrative   Lives in [place] with children 15YO and 20YO. 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       Regular Exercise -  Yes, walk 1 to 3 times a week 30 min   Daily Caffeine Use:  Diet pepsi 1-2         Social Determinants of Health   Financial Resource Strain: Not on file  Food Insecurity: Not on file  Transportation Needs: Not on file  Physical Activity: Not on file  Stress: Not on file  Social Connections: Not on file  Intimate  Partner Violence: Not on file    Review of Systems: See HPI, otherwise negative ROS  Physical Exam: BP (!) 136/92   Pulse 71   Temp (!) 97.5 F (36.4 C) (Temporal)   Resp 20   Ht '5\' 9"'$  (1.753 m)   Wt 111.1 kg   SpO2 97%   BMI 36.18 kg/m  General:   Alert,  pleasant and cooperative in NAD Head:  Normocephalic and atraumatic. Neck:  Supple; no masses or thyromegaly. Lungs:  Clear throughout to auscultation.    Heart:  Regular rate and rhythm. Abdomen:  Soft, nontender and nondistended. Normal bowel sounds, without guarding, and without rebound.   Neurologic:  Alert and  oriented x4;  grossly normal neurologically.  Impression/Plan: Hannah Noble is here for an colonoscopy to be performed for colon cancer screening  Risks, benefits, limitations, and alternatives regarding  colonoscopy have been reviewed with the patient.  Questions have been answered.  All parties agreeable.   Sherri Sear, MD  07/03/2022, 8:23 AM

## 2022-07-03 NOTE — Transfer of Care (Signed)
Immediate Anesthesia Transfer of Care Note  Patient: Hannah Noble  Procedure(s) Performed: COLONOSCOPY WITH PROPOFOL  Patient Location: Endoscopy Unit  Anesthesia Type:General  Level of Consciousness: drowsy  Airway & Oxygen Therapy: Patient Spontanous Breathing and Patient connected to nasal cannula oxygen  Post-op Assessment: Report given to RN, Post -op Vital signs reviewed and stable and Patient moving all extremities  Post vital signs: Reviewed and stable  Last Vitals:  Vitals Value Taken Time  BP 84/45 07/03/22 0930  Temp    Pulse 54 07/03/22 0932  Resp 20 07/03/22 0932  SpO2 100 % 07/03/22 0932  Vitals shown include unvalidated device data.  Last Pain:  Vitals:   07/03/22 0812  TempSrc: Temporal  PainSc: 0-No pain         Complications: No notable events documented.

## 2022-07-04 ENCOUNTER — Encounter: Payer: Self-pay | Admitting: Gastroenterology

## 2022-07-04 LAB — SURGICAL PATHOLOGY

## 2022-07-15 DIAGNOSIS — R7303 Prediabetes: Secondary | ICD-10-CM | POA: Diagnosis not present

## 2022-07-21 ENCOUNTER — Other Ambulatory Visit: Payer: Self-pay | Admitting: Family

## 2022-07-21 DIAGNOSIS — J4520 Mild intermittent asthma, uncomplicated: Secondary | ICD-10-CM

## 2022-07-21 DIAGNOSIS — L739 Follicular disorder, unspecified: Secondary | ICD-10-CM

## 2022-08-13 ENCOUNTER — Other Ambulatory Visit: Payer: Self-pay | Admitting: Family

## 2022-08-13 DIAGNOSIS — B372 Candidiasis of skin and nail: Secondary | ICD-10-CM

## 2022-08-15 DIAGNOSIS — R7303 Prediabetes: Secondary | ICD-10-CM | POA: Diagnosis not present

## 2022-08-25 ENCOUNTER — Other Ambulatory Visit: Payer: Self-pay | Admitting: Family

## 2022-08-25 DIAGNOSIS — J4520 Mild intermittent asthma, uncomplicated: Secondary | ICD-10-CM

## 2022-08-28 ENCOUNTER — Other Ambulatory Visit: Payer: Self-pay | Admitting: Family

## 2022-08-28 ENCOUNTER — Encounter: Payer: Self-pay | Admitting: Family

## 2022-08-28 DIAGNOSIS — J4520 Mild intermittent asthma, uncomplicated: Secondary | ICD-10-CM

## 2022-08-28 MED ORDER — BUDESONIDE-FORMOTEROL FUMARATE 80-4.5 MCG/ACT IN AERO: 2.0000 | INHALATION_SPRAY | Freq: Two times a day (BID) | RESPIRATORY_TRACT | 12 refills | Status: DC

## 2022-08-29 ENCOUNTER — Other Ambulatory Visit: Payer: Self-pay | Admitting: Family

## 2022-08-29 DIAGNOSIS — B372 Candidiasis of skin and nail: Secondary | ICD-10-CM

## 2022-08-29 MED ORDER — FLUCONAZOLE 150 MG PO TABS: ORAL_TABLET | ORAL | 2 refills | Status: DC

## 2022-09-13 ENCOUNTER — Ambulatory Visit: Payer: 59 | Admitting: Dermatology

## 2022-09-14 DIAGNOSIS — R7303 Prediabetes: Secondary | ICD-10-CM | POA: Diagnosis not present

## 2022-10-05 ENCOUNTER — Other Ambulatory Visit: Payer: Self-pay | Admitting: Family

## 2022-10-05 DIAGNOSIS — J4520 Mild intermittent asthma, uncomplicated: Secondary | ICD-10-CM

## 2022-10-15 DIAGNOSIS — R7303 Prediabetes: Secondary | ICD-10-CM | POA: Diagnosis not present

## 2022-11-02 ENCOUNTER — Other Ambulatory Visit: Payer: Self-pay | Admitting: Family

## 2022-11-02 DIAGNOSIS — B372 Candidiasis of skin and nail: Secondary | ICD-10-CM

## 2022-11-03 ENCOUNTER — Telehealth: Payer: Self-pay

## 2022-11-03 NOTE — Telephone Encounter (Signed)
LM FOR PT TO CB TO SCHED F/U

## 2022-11-03 NOTE — Telephone Encounter (Signed)
e      11/03/22  2:55 PM Note LM FOR PT TO CB TO SCHED F/U    Burnard Hawthorne, FNP  to [place], [place], [facility]     11/03/22 12:27 PM Refill diflucan and call pt and request appt  I am concerned she is using Diflucan more more frequently.  I would like to discuss treatment of candidiasis, dermatology consult

## 2022-11-15 DIAGNOSIS — R7303 Prediabetes: Secondary | ICD-10-CM | POA: Diagnosis not present

## 2022-11-24 ENCOUNTER — Other Ambulatory Visit: Payer: Self-pay | Admitting: Family

## 2022-11-24 DIAGNOSIS — J4520 Mild intermittent asthma, uncomplicated: Secondary | ICD-10-CM

## 2022-11-28 ENCOUNTER — Other Ambulatory Visit: Payer: Self-pay | Admitting: Family

## 2022-11-28 ENCOUNTER — Other Ambulatory Visit (HOSPITAL_COMMUNITY): Payer: Self-pay

## 2022-11-28 ENCOUNTER — Telehealth: Payer: Self-pay

## 2022-11-28 DIAGNOSIS — J4520 Mild intermittent asthma, uncomplicated: Secondary | ICD-10-CM

## 2022-11-28 MED ORDER — FLUTICASONE-SALMETEROL 100-50 MCG/ACT IN AEPB: 1.0000 | INHALATION_SPRAY | Freq: Two times a day (BID) | RESPIRATORY_TRACT | 2 refills | Status: AC

## 2022-11-28 NOTE — Telephone Encounter (Signed)
Per test claim generic Grant Ruts is covered for $10 a month (subject to change depending on filling pharmacy) please change if appropriate or advise to continue PA.

## 2022-11-28 NOTE — Telephone Encounter (Signed)
Forgot to route last note.

## 2022-11-28 NOTE — Telephone Encounter (Signed)
Lm for pt to cb.

## 2022-11-28 NOTE — Telephone Encounter (Signed)
Me      11/28/22  3:15 PM Note Lm for pt to cb     Burnard Hawthorne, FNP  to [facility]     11/28/22 12:29 PM  Let pt know symbicort not approved and sent in insurance suggested alternative wixela  Please ask her to make an appt if new inhaler is not as effective or sxs worsen

## 2022-12-14 ENCOUNTER — Other Ambulatory Visit: Payer: Self-pay | Admitting: Family

## 2022-12-14 DIAGNOSIS — J4520 Mild intermittent asthma, uncomplicated: Secondary | ICD-10-CM

## 2022-12-14 DIAGNOSIS — R7303 Prediabetes: Secondary | ICD-10-CM | POA: Diagnosis not present

## 2023-01-14 DIAGNOSIS — R7303 Prediabetes: Secondary | ICD-10-CM | POA: Diagnosis not present

## 2023-02-06 ENCOUNTER — Other Ambulatory Visit: Payer: Self-pay | Admitting: Family

## 2023-02-06 DIAGNOSIS — B372 Candidiasis of skin and nail: Secondary | ICD-10-CM

## 2023-02-07 ENCOUNTER — Other Ambulatory Visit: Payer: Self-pay | Admitting: Family

## 2023-02-07 ENCOUNTER — Telehealth: Payer: Self-pay | Admitting: Family

## 2023-02-07 DIAGNOSIS — B372 Candidiasis of skin and nail: Secondary | ICD-10-CM

## 2023-02-07 MED ORDER — FLUCONAZOLE 150 MG PO TABS: ORAL_TABLET | ORAL | 2 refills | Status: DC

## 2023-02-07 NOTE — Telephone Encounter (Signed)
Pt need a refill on fluconazole sent to [facility]

## 2023-02-13 DIAGNOSIS — R7303 Prediabetes: Secondary | ICD-10-CM | POA: Diagnosis not present

## 2023-02-25 ENCOUNTER — Other Ambulatory Visit: Payer: Self-pay | Admitting: Family

## 2023-02-25 DIAGNOSIS — J4520 Mild intermittent asthma, uncomplicated: Secondary | ICD-10-CM

## 2023-03-16 DIAGNOSIS — R7303 Prediabetes: Secondary | ICD-10-CM | POA: Diagnosis not present

## 2023-04-15 DIAGNOSIS — R7303 Prediabetes: Secondary | ICD-10-CM | POA: Diagnosis not present

## 2023-04-20 ENCOUNTER — Ambulatory Visit: Payer: 59 | Admitting: Family

## 2023-04-23 ENCOUNTER — Other Ambulatory Visit: Payer: Self-pay | Admitting: Family

## 2023-04-23 DIAGNOSIS — Z1231 Encounter for screening mammogram for malignant neoplasm of breast: Secondary | ICD-10-CM

## 2023-04-25 ENCOUNTER — Encounter (INDEPENDENT_AMBULATORY_CARE_PROVIDER_SITE_OTHER): Payer: Self-pay

## 2023-04-27 ENCOUNTER — Other Ambulatory Visit: Payer: Self-pay | Admitting: Family

## 2023-04-27 DIAGNOSIS — B372 Candidiasis of skin and nail: Secondary | ICD-10-CM

## 2023-05-03 ENCOUNTER — Ambulatory Visit
Admission: RE | Admit: 2023-05-03 | Discharge: 2023-05-03 | Disposition: A | Discharge status: Home / Self Care | Payer: No Typology Code available for payment source | Source: Ambulatory Visit | Attending: Family | Admitting: Family

## 2023-05-03 DIAGNOSIS — Z1231 Encounter for screening mammogram for malignant neoplasm of breast: Secondary | ICD-10-CM | POA: Insufficient documentation

## 2023-05-16 DIAGNOSIS — R7303 Prediabetes: Secondary | ICD-10-CM | POA: Diagnosis not present

## 2023-05-22 ENCOUNTER — Other Ambulatory Visit: Payer: Self-pay | Admitting: Family

## 2023-05-22 DIAGNOSIS — J4520 Mild intermittent asthma, uncomplicated: Secondary | ICD-10-CM

## 2023-05-24 ENCOUNTER — Other Ambulatory Visit: Payer: Self-pay | Admitting: Family

## 2023-05-24 ENCOUNTER — Encounter: Payer: Self-pay | Admitting: Family

## 2023-05-24 ENCOUNTER — Ambulatory Visit: Payer: No Typology Code available for payment source | Admitting: Family

## 2023-05-24 VITALS — BP 122/80 | HR 66 | Temp 97.9°F | Ht 69.0 in | Wt 256.0 lb

## 2023-05-24 DIAGNOSIS — B372 Candidiasis of skin and nail: Secondary | ICD-10-CM | POA: Diagnosis not present

## 2023-05-24 DIAGNOSIS — Z8639 Personal history of other endocrine, nutritional and metabolic disease: Secondary | ICD-10-CM | POA: Diagnosis not present

## 2023-05-24 DIAGNOSIS — E669 Obesity, unspecified: Secondary | ICD-10-CM | POA: Diagnosis not present

## 2023-05-24 DIAGNOSIS — Z13220 Encounter for screening for lipoid disorders: Secondary | ICD-10-CM | POA: Diagnosis not present

## 2023-05-24 DIAGNOSIS — J4520 Mild intermittent asthma, uncomplicated: Secondary | ICD-10-CM

## 2023-05-24 DIAGNOSIS — Z136 Encounter for screening for cardiovascular disorders: Secondary | ICD-10-CM | POA: Diagnosis not present

## 2023-05-24 DIAGNOSIS — Z0000 Encounter for general adult medical examination without abnormal findings: Secondary | ICD-10-CM | POA: Diagnosis not present

## 2023-05-24 LAB — VITAMIN D 25 HYDROXY (VIT D DEFICIENCY, FRACTURES): VITD: 36.37 ng/mL (ref 30.00–100.00)

## 2023-05-24 LAB — COMPREHENSIVE METABOLIC PANEL
ALT: 14 U/L (ref 0–35)
AST: 12 U/L (ref 0–37)
Albumin: 4.3 g/dL (ref 3.5–5.2)
Alkaline Phosphatase: 43 U/L (ref 39–117)
BUN: 11 mg/dL (ref 6–23)
CO2: 27 mEq/L (ref 19–32)
Calcium: 9.6 mg/dL (ref 8.4–10.5)
Chloride: 103 mEq/L (ref 96–112)
Creatinine, Ser: 0.79 mg/dL (ref 0.40–1.20)
GFR: 87.5 mL/min (ref >60.00)
Glucose, Bld: 84 mg/dL (ref 70–99)
Potassium: 4.9 mEq/L (ref 3.5–5.1)
Sodium: 136 mEq/L (ref 135–145)
Total Bilirubin: 0.5 mg/dL (ref 0.2–1.2)
Total Protein: 7.5 g/dL (ref 6.0–8.3)

## 2023-05-24 LAB — CBC WITH DIFFERENTIAL/PLATELET
Basophils Absolute: 0.1 10*3/uL (ref 0.0–0.1)
Basophils Relative: 0.7 % (ref 0.0–3.0)
Eosinophils Absolute: 0.2 10*3/uL (ref 0.0–0.7)
Eosinophils Relative: 3.1 % (ref 0.0–5.0)
HCT: 38.5 % (ref 36.0–46.0)
Hemoglobin: 12.7 g/dL (ref 12.0–15.0)
Lymphocytes Relative: 20.9 % (ref 12.0–46.0)
Lymphs Abs: 1.6 10*3/uL (ref 0.7–4.0)
MCHC: 33 g/dL (ref 30.0–36.0)
MCV: 87.9 fl (ref 78.0–100.0)
Monocytes Absolute: 0.5 10*3/uL (ref 0.1–1.0)
Monocytes Relative: 6.1 % (ref 3.0–12.0)
Neutro Abs: 5.3 10*3/uL (ref 1.4–7.7)
Neutrophils Relative %: 69.2 % (ref 43.0–77.0)
Platelets: 322 10*3/uL (ref 150.0–400.0)
RBC: 4.38 Mil/uL (ref 3.87–5.11)
RDW: 13.7 % (ref 11.5–15.5)
WBC: 7.6 10*3/uL (ref 4.0–10.5)

## 2023-05-24 LAB — LIPID PANEL
Cholesterol: 189 mg/dL (ref 0–200)
HDL: 67.3 mg/dL (ref >39.00)
LDL Cholesterol: 93 mg/dL (ref 0–99)
NonHDL: 121.97
Total CHOL/HDL Ratio: 3
Triglycerides: 145 mg/dL (ref 0.0–149.0)
VLDL: 29 mg/dL (ref 0.0–40.0)

## 2023-05-24 LAB — HEMOGLOBIN A1C: Hgb A1c MFr Bld: 5.5 % (ref 4.6–6.5)

## 2023-05-24 LAB — TSH: TSH: 2.74 u[IU]/mL (ref 0.35–5.50)

## 2023-05-24 MED ORDER — WEGOVY 0.25 MG/0.5ML ~~LOC~~ SOAJ
0.2500 mg | SUBCUTANEOUS | 2 refills | Status: DC
Start: 2023-05-24 — End: 2023-07-17

## 2023-05-24 NOTE — Progress Notes (Signed)
Assessment & Plan:  Routine general medical examination at a health care facility Assessment & Plan: Politely declines clinical breast exam in the office.  Deferred pelvic exam in the absence of complaints and Pap smear is up-to-date.  Orders: -     CBC with Differential/Platelet -     Comprehensive metabolic panel -     Ambulatory referral to Dermatology  Encounter for lipid screening for cardiovascular disease -     Comprehensive metabolic panel -     Lipid panel  History of vitamin D deficiency -     VITAMIN D 25 Hydroxy (Vit-D Deficiency, Fractures)  Obesity (BMI 30-39.9) Assessment & Plan: Discussed low carbohydrate diet.  Patient interested in medication for weight loss.  We discussed Wegovy, mechanism of action, blackbox warning as a relates to multiple endocrine neoplasia, medullary thyroid cancer.  We also discussed titration and side effects.  Start Wegovy 0.25 mg weekly, titrate.  If Reginal Lutes is not covered by insurance we discussed a trial of metformin.   Orders: -     Hemoglobin A1c -     TSH -     ZOXWRU; Inject 0.25 mg into the skin once a week.  Dispense: 2 mL; Refill: 2  Candidal intertrigo Assessment & Plan: Replaced referral to dermatology  Orders: -     Ambulatory referral to Dermatology     Return precautions given.   Risks, benefits, and alternatives of the medications and treatment plan prescribed today were discussed, and patient expressed understanding.   Education regarding symptom management and diagnosis given to patient on AVS either electronically or printed.  Return in about 6 weeks (around 07/05/2023).  Rennie Plowman, FNP  Subjective:    Patient ID: Gunnar Fusi, female    DOB: [birth year], 52 y.o.   MRN: [id number]  CC: NAYLAA HAYMES is a 52 y.o. female who presents today for physical exam.    HPI: Frustrated by weight gain.  Father passed away 8 months ago.    Endorses overeating.  No regular exercise at this time No  personal or family history of thyroid cancer, multiple endocrine neoplasia  Denies depressive symptoms  She would still like to establish care with dermatology.  No new skin lesions.     Colorectal Cancer Screening: UTD , 06/2022; repeat in 5 years Breast Cancer Screening: Mammogram UTD Cervical Cancer Screening: UTD, 04/12/22 negative HPV, malignancy Bone Health screening/DEXA for 65+: No increased fracture risk. Defer screening at this time.  Lung Cancer Screening: Doesn't have 20 year pack year history and age > 29 years yo 20 years          Tetanus - UTD         Alcohol use: Occasional Smoking/tobacco use: former smoker.    Health Maintenance  Topic Date Due   COVID-19 Vaccine (6 - 2023-24 season) 05/26/2022   Flu Shot  12/24/2023*   Pap Smear  04/12/2025   Colon Cancer Screening  07/04/2027   DTaP/Tdap/Td vaccine (2 - Td or Tdap) 11/17/2029   Hepatitis C Screening  Completed   HIV Screening  Completed   HPV Vaccine  Aged Out  *Topic was postponed. The date shown is not the original due date.    ALLERGIES: Patient has no known allergies.  Current Outpatient Medications on File Prior to Visit  Medication Sig Dispense Refill   albuterol (VENTOLIN HFA) 108 (90 Base) MCG/ACT inhaler INHALE 2 PUFFS INTO THE LUNGS EVERY 4 HOURS AS NEEDED FOR WHEEZE OR FOR SHORTNESS  OF BREATH 6.7 each 2   Biotin 10 MG TABS Take by mouth.     cholecalciferol (VITAMIN D3) 25 MCG (1000 UNIT) tablet Take 1,000 Units by mouth daily.     fluconazole (DIFLUCAN) 150 MG tablet TAKE 1 TABLET BY MOUTH ONCE FOR 1 DOSE. IF SYMPTOMS PERSIST, MAY TAKE ONE TABLET 3 DAYS LATER. 2 tablet 2   fluticasone-salmeterol (WIXELA INHUB) 100-50 MCG/ACT AEPB Inhale 1 puff into the lungs 2 (two) times daily. 1 each 2   Multiple Vitamin (MULTIVITAMIN) tablet Take 1 tablet by mouth daily.     mupirocin ointment (BACTROBAN) 2 % APPLY TO AFFECTED AREA TWICE A DAY 22 g 2   vitamin B-12 (CYANOCOBALAMIN) 500 MCG tablet Take  500 mcg by mouth daily.     No current facility-administered medications on file prior to visit.    Review of Systems  Constitutional:  Negative for chills, fever and unexpected weight change.  HENT:  Negative for congestion.   Respiratory:  Negative for cough.   Cardiovascular:  Negative for chest pain, palpitations and leg swelling.  Gastrointestinal:  Negative for nausea and vomiting.  Musculoskeletal:  Negative for arthralgias and myalgias.  Skin:  Negative for rash.  Neurological:  Negative for headaches.  Hematological:  Negative for adenopathy.  Psychiatric/Behavioral:  Negative for confusion.       Objective:    BP 122/80   Pulse 66   Temp 97.9 F (36.6 C) (Oral)   Ht 5\' 9"  (1.753 m)   Wt 256 lb (116.1 kg)   LMP  (LMP Unknown)   SpO2 96%   BMI 37.80 kg/m   BP Readings from Last 3 Encounters:  05/24/23 122/80  07/03/22 117/66  04/12/22 124/72   Wt Readings from Last 3 Encounters:  05/24/23 256 lb (116.1 kg)  07/03/22 245 lb (111.1 kg)  04/12/22 237 lb 6.4 oz (107.7 kg)    Physical Exam Vitals reviewed.  Constitutional:      Appearance: She is well-developed.  Eyes:     Conjunctiva/sclera: Conjunctivae normal.  Neck:     Thyroid: No thyroid mass or thyromegaly.  Cardiovascular:     Rate and Rhythm: Normal rate and regular rhythm.     Pulses: Normal pulses.     Heart sounds: Normal heart sounds.  Pulmonary:     Effort: Pulmonary effort is normal.     Breath sounds: Normal breath sounds. No wheezing, rhonchi or rales.  Lymphadenopathy:     Head:     Right side of head: No submental, submandibular, tonsillar, preauricular, posterior auricular or occipital adenopathy.     Left side of head: No submental, submandibular, tonsillar, preauricular, posterior auricular or occipital adenopathy.     Cervical: No cervical adenopathy.  Skin:    General: Skin is warm and dry.  Neurological:     Mental Status: She is alert.  Psychiatric:        Speech: Speech  normal.        Behavior: Behavior normal.        Thought Content: Thought content normal.

## 2023-05-24 NOTE — Assessment & Plan Note (Addendum)
Discussed low carbohydrate diet.  Patient interested in medication for weight loss.  We discussed Wegovy, mechanism of action, blackbox warning as a relates to multiple endocrine neoplasia, medullary thyroid cancer.  We also discussed titration and side effects.  Start Wegovy 0.25 mg weekly, titrate.  If Reginal Lutes is not covered by insurance we discussed a trial of metformin.

## 2023-05-24 NOTE — Assessment & Plan Note (Signed)
Replaced referral to dermatology

## 2023-05-24 NOTE — Patient Instructions (Addendum)
We have discussed starting non insulin daily injectable medication called Wegovy  which is a glucagon like peptide (GLP 1) agonist and works by delaying gastric emptying and increasing insulin secretion.It is given once per week. Most patients see significant weight loss with this drug class.   You may NOT take either medication if you or your family has history of thyroid, parathyroid, OR adrenal cancer. Please confirm you and your family does NOT have this history as this drug class has black box warning on this medication for that reason.   Please follow  directions on prescription and slowly increase from 0.25mg  [place] once per week ;stay here for 4 weeks. You may then increase to 0.5mg  [place] once per week and stay there for 4 weeks.  We can slowly titrate further at follow up with goal of no more than 1-2 lbs weight loss per week.  Semaglutide [facility])  Dose (mg) Once Weekly Titration:    If a dose is not tolerated, consider delaying further dose increases for  another 4 weeks.  If you are actively losing weight on a dose, do not increase medication.   Weeks 1 through 4  0.25 mg once weekly  Weeks 5 through 8  0.5 mg once weekly  Weeks 9 though 12  1 mg once weekly  Weeks 13 through 16  1.7 mg once weekly   Semaglutide Injection (Weight Management) What is this medication? SEMAGLUTIDE (SEM a GLOO tide) promotes weight loss. It may also be used to maintain weight loss. It works by decreasing appetite. Changes to diet and exercise are often combined with this medication. This medicine may be used for other purposes; ask your health care provider or pharmacist if you have questions. COMMON BRAND NAME(S): VOZDGU What should I tell my care team before I take this medication? They need to know if you have any of these conditions: Endocrine tumors (MEN 2) or if someone in your family had these tumors Eye disease, vision problems Gallbladder disease History of depression or mental health  disease History of pancreatitis Kidney disease Stomach or intestine problems Suicidal thoughts, plans, or attempt; a previous suicide attempt by you or a family member Thyroid cancer or if someone in your family had thyroid cancer An unusual or allergic reaction to semaglutide, other medications, foods, dyes, or preservatives Pregnant or trying to get pregnant Breast-feeding How should I use this medication? This medication is injected under the skin. You will be taught how to prepare and give it. Take it as directed on the prescription label. It is given once every week (every 7 days). Keep taking it unless your care team tells you to stop. It is important that you put your used needles and pens in a special sharps container. Do not put them in a trash can. If you do not have a sharps container, call your pharmacist or care team to get one. A special MedGuide will be given to you by the pharmacist with each prescription and refill. Be sure to read this information carefully each time. This medication comes with INSTRUCTIONS FOR USE. Ask your pharmacist for directions on how to use this medication. Read the information carefully. Talk to your pharmacist or care team if you have questions. Talk to your care team about the use of this medication in children. While it may be prescribed for children as young as 12 years for selected conditions, precautions do apply. Overdosage: If you think you have taken too much of this medicine contact a  poison control center or emergency room at once. NOTE: This medicine is only for you. Do not share this medicine with others. What if I miss a dose? If you miss a dose and the next scheduled dose is more than 2 days away, take the missed dose as soon as possible. If you miss a dose and the next scheduled dose is less than 2 days away, do not take the missed dose. Take the next dose at your regular time. Do not take double or extra doses. If you miss your dose for 2  weeks or more, take the next dose at your regular time or call your care team to talk about how to restart this medication. What may interact with this medication? Insulin and other medications for diabetes This list may not describe all possible interactions. Give your health care provider a list of all the medicines, herbs, non-prescription drugs, or dietary supplements you use. Also tell them if you smoke, drink alcohol, or use illegal drugs. Some items may interact with your medicine. What should I watch for while using this medication? Visit your care team for regular checks on your progress. It may be some time before you see the benefit from this medication. Drink plenty of fluids while taking this medication. Check with your care team if you have severe diarrhea, nausea, and vomiting, or if you sweat a lot. The loss of too much body fluid may make it dangerous for you to take this medication. This medication may affect blood sugar levels. Ask your care team if changes in diet or medications are needed if you have diabetes. If you or your family notice any changes in your behavior, such as new or worsening depression, thoughts of harming yourself, anxiety, other unusual or disturbing thoughts, or memory loss, call your care team right away. Women should inform their care team if they wish to become pregnant or think they might be pregnant. Losing weight while pregnant is not advised and may cause harm to the unborn child. Talk to your care team for more information. What side effects may I notice from receiving this medication? Side effects that you should report to your care team as soon as possible: Allergic reactions--skin rash, itching, hives, swelling of the face, lips, tongue, or throat Change in vision Dehydration--increased thirst, dry mouth, feeling faint or lightheaded, headache, dark yellow or brown urine Gallbladder problems--severe stomach pain, nausea, vomiting, fever Heart  palpitations--rapid, pounding, or irregular heartbeat Kidney injury--decrease in the amount of urine, swelling of the ankles, hands, or feet Pancreatitis--severe stomach pain that spreads to your back or gets worse after eating or when touched, fever, nausea, vomiting Thoughts of suicide or self-harm, worsening mood, feelings of depression Thyroid cancer--new mass or lump in the neck, pain or trouble swallowing, trouble breathing, hoarseness Side effects that usually do not require medical attention (report to your care team if they continue or are bothersome): Diarrhea Loss of appetite Nausea Stomach pain Vomiting This list may not describe all possible side effects. Call your doctor for medical advice about side effects. You may report side effects to FDA at 1-800-FDA-1088. Where should I keep my medication? Keep out of the reach of children and pets. Refrigeration (preferred): Store in the refrigerator. Do not freeze. Keep this medication in the original container until you are ready to take it. Get rid of any unused medication after the expiration date. Room temperature: If needed, prior to cap removal, the pen can be stored at room temperature  for up to 28 days. Protect from light. If it is stored at room temperature, get rid of any unused medication after 28 days or after it expires, whichever is first. It is important to get rid of the medication as soon as you no longer need it or it is expired. You can do this in two ways: Take the medication to a medication take-back program. Check with your pharmacy or law enforcement to find a location. If you cannot return the medication, follow the directions in the MedGuide. NOTE: This sheet is a summary. It may not cover all possible information. If you have questions about this medicine, talk to your doctor, pharmacist, or health care provider.  2023 Elsevier/Gold Standard (2020-11-25 00:00:00)  Please download Myfitness Pal App ( basic  version is free).   You may log every thing you eat for even 2-3 days to get a better of idea of total daily calories. To loose weight, we have to create caloric deficit to loose weight. The goal is 1-2 lbs per week of weight loss.   Excellent article below from [facility].   [email or web address].it  Calorie counting made easy  Eat less, exercise more. If only it were that simple! As most dieters know, losing weight can be very challenging. As this report details, a range of influences can affect how people gain and lose weight. But a basic understanding of how to tip your energy balance in favor of weight loss is a good place to start.  Start by determining how many calories you should consume each day. To do so, you need to know how many calories you need to maintain your current weight. Doing this requires a few simple calculations.  First, multiply your current weight by 15 -- that's roughly the number of calories per pound of body weight needed to maintain your current weight if you are moderately active. Moderately active means getting at least 30 minutes of physical activity a day in the form of exercise (walking at a brisk pace, climbing stairs, or active gardening). Let's say you're a woman who is 5 feet, 4 inches tall and weighs 155 pounds, and you need to lose about 15 pounds to put you in a healthy weight range. If you multiply 155 by 15, you will get 2,325, which is the number of calories per day that you need in order to maintain your current weight (weight-maintenance calories). To lose weight, you will need to get below that total.  For example, to lose 1 to 2 pounds a week -- a rate that experts consider safe -- your food consumption should provide 500 to 1,000 calories less than your total weight-maintenance calories. If you need 2,325 calories a day to maintain your current weight, reduce your daily calories to between 1,325 and  1,825. If you are sedentary, you will also need to build more activity into your day. In order to lose at least a pound a week, try to do at least 30 minutes of physical activity on most days, and reduce your daily calorie intake by at least 500 calories. However, calorie intake should not fall below 1,200 a day in women or 1,500 a day in men, except under the supervision of a health professional. Eating too few calories can endanger your health by depriving you of needed nutrients.  Meeting your calorie target How can you meet your daily calorie target? One approach is to add up the number of calories per serving of all the foods that  you eat, and then plan your menus accordingly. You can buy books that list calories per serving for many foods. In addition, the nutrition labels on all packaged foods and beverages provide calories per serving information. Make a point of reading the labels of the foods and drinks you use, noting the number of calories and the serving sizes. Many recipes published in cookbooks, newspapers, and magazines provide similar information.  If you hate counting calories, a different approach is to restrict how much and how often you eat, and to eat meals that are low in calories. Dietary guidelines issued by the American Heart Association stress common sense in choosing your foods rather than focusing strictly on numbers, such as total calories or calories from fat. Whichever method you choose, research shows that a regular eating schedule -- with meals and snacks planned for certain times each day -- makes for the most successful approach. The same applies after you have lost weight and want to keep it off. Sticking with an eating schedule increases your chance of maintaining your new weight.    This is  Dr. Melina Schools  ( an amazing physician in my office!)  example of a  "Low GI"  Diet:  It will allow you to lose 4 to 8  lbs  per month if you follow it carefully.  Your goal with  exercise is a minimum of 30 minutes of aerobic exercise 5 days per week (Walking does not count once it becomes easy!)    All of the foods can be found at grocery stores and in bulk at [employer].  The Atkins protein bars and shakes are available in more varieties at Target, [employer] and [employer].     7 AM Breakfast:  Choose from the following:  Low carbohydrate Protein  Shakes (I recommend the  Premier Protein chocolate shakes,  EAS AdvantEdge "Carb Control" shakes  Or the Atkins shakes all are under 3 net carbs)     a scrambled egg/bacon/cheese burrito made with Mission's "carb balance" whole wheat tortilla  (about 10 net carbs )  Medical laboratory scientific officer (basically a quiche without the pastry crust) that is eaten cold and very convenient way to get your eggs.  8 carbs)  If you make your own protein shakes, avoid bananas and pineapple,  And use low carb greek yogurt or original /unsweetened almond or soy milk    Avoid cereal and bananas, oatmeal and cream of wheat and grits. They are loaded with carbohydrates!   10 AM: high protein snack:  Protein bar by Atkins (the snack size, under 200 cal, usually < 6 net carbs).    A stick of cheese:  Around 1 carb,  100 cal     Dannon Light n Fit [place] Yogurt  (80 cal, 8 carbs)  Other so called "protein bars" and Greek yogurts tend to be loaded with carbohydrates.  Remember, in food advertising, the word "energy" is synonymous for " carbohydrate."  Lunch:   A Sandwich using the bread choices listed, Can use any  Eggs,  lunchmeat, grilled meat or canned tuna), avocado, regular mayo/mustard  and cheese.  A Salad using blue cheese, ranch,  Goddess or vinagrette,  Avoid taco shells, croutons or "confetti" and no "candied nuts" but regular nuts OK.   No pretzels, nabs  or chips.  Pickles and miniature sweet peppers are a good low carb alternative that provide a "crunch"  The bread is the only source of carbohydrate in  a sandwich and  can  be decreased by trying some of the attached alternatives to traditional loaf bread   Avoid "Low fat dressings, as well as Reyne Dumas and [address] dressings They are loaded with sugar!   3 PM/ Mid day  Snack:  Consider  1 ounce of  almonds, walnuts, pistachios, pecans, peanuts,  Macadamia nuts or a nut medley.  Avoid "granola and granola bars "  Mixed nuts are ok in moderation as long as there are no raisins,  cranberries or dried fruit.   KIND bars are OK if you get the low glycemic index variety   Try the prosciutto/mozzarella cheese sticks by F[employer]  In deli /backery section   High protein      6 PM  Dinner:     Meat/fowl/fish with a green salad, and either broccoli, cauliflower, green beans, spinach, brussel sprouts or  Lima beans. DO NOT BREAD THE PROTEIN!!      There is a low carb pasta by Dreamfield's that is acceptable and tastes great: only 5 digestible carbs/serving.( All grocery stores but BJs carry it ) Several ready made meals are available low carb:   Try Michel Angelo's chicken piccata or chicken or eggplant parm over low carb pasta.(Lowes and BJs)   Clifton Custard Sanchez's "Carnitas" (pulled pork, no sauce,  0 carbs) or his beef pot roast to make a dinner burrito (at [employer]J's)  Pesto over low carb pasta (bj's sells a good quality pesto in the center refrigerated section of the deli   Try satueeing  [place] Harps with mushroooms as a good side   [employer] Giant makes a mashed cauliflower that tastes like mashed potatoes  Whole wheat pasta is still full of digestible carbs and  Not as low in glycemic index as Dreamfield's.   Brown rice is still rice,  So skip the rice and noodles if you eat [place] or [place] (or at least limit to 1/2 cup)  9 PM snack :   Breyer's "low carb" fudgsicle or  ice cream bar (Carb Smart line), or  Weight Watcher's ice cream bar , or another "no sugar added" ice cream;  a serving of fresh berries/cherries with whipped cream   Cheese or [employer]S [employer] [place]  YOGURT  8 ounces of [place] unsweetened almond/cococunut milk    Treat yourself to a parfait made with whipped cream blueberiies, walnuts and vanilla greek yogurt  Avoid bananas, pineapple, grapes  and watermelon on a regular basis because they are high in sugar.  THINK OF THEM AS DESSERT  Remember that snack Substitutions should be less than 10 NET carbs per serving and meals < 20 carbs. Remember to subtract fiber grams to get the "net carbs."  Health Maintenance for Postmenopausal Women Menopause is a normal process in which your ability to get pregnant comes to an end. This process happens slowly over many months or years, usually between the ages of 14 and 63. Menopause is complete when you have missed your menstrual period for 12 months. It is important to talk with your health care provider about some of the most common conditions that affect women after menopause (postmenopausal women). These include heart disease, cancer, and bone loss (osteoporosis). Adopting a healthy lifestyle and getting preventive care can help to promote your health and wellness. The actions you take can also lower your chances of developing some of these common conditions. What are the signs and symptoms of menopause? During menopause, you may have the following symptoms:  Hot flashes. These can be moderate or severe. Night sweats. Decrease in sex drive. Mood swings. Headaches. Tiredness (fatigue). Irritability. Memory problems. Problems falling asleep or staying asleep. Talk with your health care provider about treatment options for your symptoms. Do I need hormone replacement therapy? Hormone replacement therapy is effective in treating symptoms that are caused by menopause, such as hot flashes and night sweats. Hormone replacement carries certain risks, especially as you become older. If you are thinking about using estrogen or estrogen with progestin, discuss the benefits and risks with your health  care provider. How can I reduce my risk for heart disease and stroke? The risk of heart disease, heart attack, and stroke increases as you age. One of the causes may be a change in the body's hormones during menopause. This can affect how your body uses dietary fats, triglycerides, and cholesterol. Heart attack and stroke are medical emergencies. There are many things that you can do to help prevent heart disease and stroke. Watch your blood pressure High blood pressure causes heart disease and increases the risk of stroke. This is more likely to develop in people who have high blood pressure readings or are overweight. Have your blood pressure checked: Every 3-5 years if you are 13-90 years of age. Every year if you are 14 years old or older. Eat a healthy diet  Eat a diet that includes plenty of vegetables, fruits, low-fat dairy products, and lean protein. Do not eat a lot of foods that are high in solid fats, added sugars, or sodium. Get regular exercise Get regular exercise. This is one of the most important things you can do for your health. Most adults should: Try to exercise for at least 150 minutes each week. The exercise should increase your heart rate and make you sweat (moderate-intensity exercise). Try to do strengthening exercises at least twice each week. Do these in addition to the moderate-intensity exercise. Spend less time sitting. Even light physical activity can be beneficial. Other tips Work with your health care provider to achieve or maintain a healthy weight. Do not use any products that contain nicotine or tobacco. These products include cigarettes, chewing tobacco, and vaping devices, such as e-cigarettes. If you need help quitting, ask your health care provider. Know your numbers. Ask your health care provider to check your cholesterol and your blood sugar (glucose). Continue to have your blood tested as directed by your health care provider. Do I need screening for  cancer? Depending on your health history and family history, you may need to have cancer screenings at different stages of your life. This may include screening for: Breast cancer. Cervical cancer. Lung cancer. Colorectal cancer. What is my risk for osteoporosis? After menopause, you may be at increased risk for osteoporosis. Osteoporosis is a condition in which bone destruction happens more quickly than new bone creation. To help prevent osteoporosis or the bone fractures that can happen because of osteoporosis, you may take the following actions: If you are 45-8 years old, get at least 1,000 mg of calcium and at least 600 international units (IU) of vitamin D per day. If you are older than age 64 but younger than age 54, get at least 1,200 mg of calcium and at least 600 international units (IU) of vitamin D per day. If you are older than age 86, get at least 1,200 mg of calcium and at least 800 international units (IU) of vitamin D per day. Smoking and drinking excessive alcohol increase the risk of  osteoporosis. Eat foods that are rich in calcium and vitamin D, and do weight-bearing exercises several times each week as directed by your health care provider. How does menopause affect my mental health? Depression may occur at any age, but it is more common as you become older. Common symptoms of depression include: Feeling depressed. Changes in sleep patterns. Changes in appetite or eating patterns. Feeling an overall lack of motivation or enjoyment of activities that you previously enjoyed. Frequent crying spells. Talk with your health care provider if you think that you are experiencing any of these symptoms. General instructions See your health care provider for regular wellness exams and vaccines. This may include: Scheduling regular health, dental, and eye exams. Getting and maintaining your vaccines. These include: Influenza vaccine. Get this vaccine each year before the flu season  begins. Pneumonia vaccine. Shingles vaccine. Tetanus, diphtheria, and pertussis (Tdap) booster vaccine. Your health care provider may also recommend other immunizations. Tell your health care provider if you have ever been abused or do not feel safe at home. Summary Menopause is a normal process in which your ability to get pregnant comes to an end. This condition causes hot flashes, night sweats, decreased interest in sex, mood swings, headaches, or lack of sleep. Treatment for this condition may include hormone replacement therapy. Take actions to keep yourself healthy, including exercising regularly, eating a healthy diet, watching your weight, and checking your blood pressure and blood sugar levels. Get screened for cancer and depression. Make sure that you are up to date with all your vaccines. This information is not intended to replace advice given to you by your health care provider. Make sure you discuss any questions you have with your health care provider. Document Revised: 01/31/2021 Document Reviewed: 01/31/2021 Elsevier Patient Education  2024 [employer].

## 2023-05-24 NOTE — Assessment & Plan Note (Addendum)
Politely declines clinical breast exam in the office.  Deferred pelvic exam in the absence of complaints and Pap smear is up-to-date.

## 2023-05-30 ENCOUNTER — Encounter: Payer: Self-pay | Admitting: Family

## 2023-06-04 ENCOUNTER — Telehealth: Payer: Self-pay

## 2023-06-04 ENCOUNTER — Other Ambulatory Visit (HOSPITAL_COMMUNITY): Payer: Self-pay

## 2023-06-04 NOTE — Telephone Encounter (Signed)
PT has been notified.

## 2023-06-04 NOTE — Telephone Encounter (Signed)
Pharmacy Patient Advocate Encounter   Received notification from Patient Advice Request messages that prior authorization for Wegovy 0.25MG /0.5ML auto-injectors is required/requested.   Insurance verification completed.   The patient is insured through [facility] .   Per test claim: PA required; PA submitted to [employer] [facility] via CoverMyMeds Key/confirmation #/EOC [facility] Status is pending

## 2023-06-04 NOTE — Telephone Encounter (Signed)
Pharmacy Patient Advocate Encounter  Received notification from [employer] that Prior Authorization for Hannah Noble has been APPROVED from 06/04/2023 to 01/02/2024   PA #/Case ID/Reference #: [id number]

## 2023-06-10 ENCOUNTER — Ambulatory Visit
Admission: RE | Admit: 2023-06-10 | Discharge: 2023-06-10 | Disposition: A | Discharge status: Home / Self Care | Payer: No Typology Code available for payment source | Source: Ambulatory Visit | Attending: Emergency Medicine | Admitting: Emergency Medicine

## 2023-06-10 VITALS — BP 141/83 | HR 80 | Temp 98.5°F | Resp 14 | Ht 69.0 in | Wt 256.0 lb

## 2023-06-10 DIAGNOSIS — L03211 Cellulitis of face: Secondary | ICD-10-CM | POA: Diagnosis not present

## 2023-06-10 MED ORDER — SULFAMETHOXAZOLE-TRIMETHOPRIM 800-160 MG PO TABS: 1.0000 | ORAL_TABLET | Freq: Two times a day (BID) | ORAL | 0 refills | Status: AC

## 2023-06-10 MED ORDER — PREDNISONE 20 MG PO TABS: 20.0000 mg | ORAL_TABLET | Freq: Every day | ORAL | 0 refills | Status: AC

## 2023-06-10 NOTE — ED Provider Notes (Signed)
[facility]    CSN: [id number] Arrival date & time: 06/10/23  1104      History   Chief Complaint Chief Complaint  Patient presents with   Otalgia    right    HPI Hannah Noble is a 52 y.o. female.  Patient is presenting with swelling of the right lateral face below the ear and behind the ear.  Patient says this started several days ago as what she thought was a pimple behind her right ear.  But pain and swelling has gotten worse.   Otalgia Location:  Right Behind ear:  Redness and swelling Quality:  Aching Severity:  Moderate Onset quality:  Gradual Progression:  Worsening Ineffective treatments:  None tried   Past Medical History:  Diagnosis Date   Arthritis    Asthma    Iron deficiency anemia 03/23/2020    Patient Active Problem List   Diagnosis Date Noted   Polyp of descending colon    Dysmenorrhea 04/12/2022   B12 deficiency 02/14/2021   Iron deficiency anemia    Encounter for screening colonoscopy    Candidal intertrigo 10/28/2018   Tinea corporis 10/14/2018   Folliculitis 11/13/2016   Screening for breast cancer 09/30/2014   Routine general medical examination at a health care facility 07/15/2013   Obesity (BMI 30-39.9) 07/15/2013   Asthma in adult 11/21/2011   Cervical dysplasia 11/21/2011    Past Surgical History:  Procedure Laterality Date   BREAST BIOPSY Right 2010   needle, with Sankar   COLONOSCOPY WITH PROPOFOL N/A 03/25/2020   Procedure: COLONOSCOPY WITH PROPOFOL;  Surgeon: Pasty Spillers, MD;  Location: [facility] ENDOSCOPY;  Service: Endoscopy;  Laterality: N/A;   COLONOSCOPY WITH PROPOFOL N/A 07/03/2022   Procedure: COLONOSCOPY WITH PROPOFOL;  Surgeon: Toney Reil, MD;  Location: [facility] ENDOSCOPY;  Service: Gastroenterology;  Laterality: N/A;   ESOPHAGOGASTRODUODENOSCOPY (EGD) WITH PROPOFOL N/A 03/25/2020   Procedure: ESOPHAGOGASTRODUODENOSCOPY (EGD) WITH PROPOFOL;  Surgeon: Pasty Spillers, MD;  Location: [facility]  ENDOSCOPY;  Service: Endoscopy;  Laterality: N/A;   ESSURE TUBAL LIGATION  2012   Dr Harold Hedge   GIVENS CAPSULE STUDY N/A 07/20/2020   Procedure: GIVENS CAPSULE STUDY;  Surgeon: Pasty Spillers, MD;  Location: [facility] ENDOSCOPY;  Service: Endoscopy;  Laterality: N/A;   TUBAL LIGATION     VAGINAL DELIVERY     X2    OB History   No obstetric history on file.      Home Medications    Prior to Admission medications   Medication Sig Start Date End Date Taking? Authorizing Provider  predniSONE (DELTASONE) 20 MG tablet Take 1 tablet (20 mg total) by mouth daily with breakfast for 4 days. 06/10/23 06/14/23 Yes Sholom Dulude, Linde Gillis, NP  sulfamethoxazole-trimethoprim (BACTRIM DS) 800-160 MG tablet Take 1 tablet by mouth 2 (two) times daily for 7 days. 06/10/23 06/17/23 Yes Aryah Doering, Linde Gillis, NP  albuterol (VENTOLIN HFA) 108 (90 Base) MCG/ACT inhaler INHALE 2 PUFFS INTO THE LUNGS EVERY 4 HOURS AS NEEDED FOR WHEEZE OR FOR SHORTNESS OF BREATH 05/23/23   Allegra Grana, FNP  Biotin 10 MG TABS Take by mouth.    [provider]  cholecalciferol (VITAMIN D3) 25 MCG (1000 UNIT) tablet Take 1,000 Units by mouth daily.    [provider]  fluconazole (DIFLUCAN) 150 MG tablet TAKE 1 TABLET BY MOUTH ONCE FOR 1 DOSE. IF SYMPTOMS PERSIST, MAY TAKE ONE TABLET 3 DAYS LATER. 05/02/23   Allegra Grana, FNP  fluticasone-salmeterol [facility] INHUB) 100-50  MCG/ACT AEPB Inhale 1 puff into the lungs 2 (two) times daily. 11/28/22   Allegra Grana, FNP  Multiple Vitamin (MULTIVITAMIN) tablet Take 1 tablet by mouth daily.    [provider]  mupirocin ointment (BACTROBAN) 2 % APPLY TO AFFECTED AREA TWICE A DAY 07/24/22   Allegra Grana, FNP  Semaglutide-Weight Management (WEGOVY) 0.25 MG/0.5ML SOAJ Inject 0.25 mg into the skin once a week. 05/24/23   Allegra Grana, FNP  vitamin B-12 (CYANOCOBALAMIN) 500 MCG tablet Take 500 mcg by mouth daily.    [provider]    Family  History Family History  Problem Relation Age of Onset   COPD Father    Cancer Maternal Grandfather        melanoma   Cancer Paternal Grandmother        lung   Breast cancer Paternal Grandmother 14   Colon cancer Neg Hx    Thyroid cancer Neg Hx     Social History Social History   Tobacco Use   Smoking status: Former    Current packs/day: 0.00    Types: Cigarettes    Quit date: 09/25/2012    Years since quitting: 10.7   Smokeless tobacco: Never  Vaping Use   Vaping status: Every Day  Substance Use Topics   Alcohol use: Yes    Comment: Rarely none last 24hrs   Drug use: No     Allergies   Patient has no known allergies.   Review of Systems Review of Systems  HENT:  Positive for ear pain.   All other systems reviewed and are negative.    Physical Exam Triage Vital Signs ED Triage Vitals  Encounter Vitals Group     BP 06/10/23 1114 (!) 141/83     Systolic BP Percentile --      Diastolic BP Percentile --      Pulse Rate 06/10/23 1114 80     Resp 06/10/23 1114 14     Temp 06/10/23 1114 98.5 F (36.9 C)     Temp Source 06/10/23 1114 Oral     SpO2 06/10/23 1114 97 %     Weight 06/10/23 1113 255 lb 15.3 oz (116.1 kg)     Height 06/10/23 1113 5\' 9"  (1.753 m)     Head Circumference --      Peak Flow --      Pain Score 06/10/23 1113 3     Pain Loc --      Pain Education --      Exclude from Growth Chart --    No data found.  Updated Vital Signs BP (!) 141/83 (BP Location: Left Arm)   Pulse 80   Temp 98.5 F (36.9 C) (Oral)   Resp 14   Ht 5\' 9"  (1.753 m)   Wt 255 lb 15.3 oz (116.1 kg)   LMP 06/05/2023 (Exact Date)   SpO2 97%   BMI 37.80 kg/m   Visual Acuity Right Eye Distance:   Left Eye Distance:   Bilateral Distance:    Right Eye Near:   Left Eye Near:    Bilateral Near:     Physical Exam Vitals and nursing note reviewed.  HENT:     Head:     Comments: Right posterior ear, right jawline  Red swollen tender to touch.    Right Ear:  Tympanic membrane and ear canal normal.     Left Ear: Tympanic membrane and ear canal normal.     Nose: Nose normal.  Eyes:     Pupils: Pupils are equal, round, and reactive to light.  Cardiovascular:     Rate and Rhythm: Normal rate and regular rhythm.  Pulmonary:     Effort: Pulmonary effort is normal.     Breath sounds: Normal breath sounds.  Musculoskeletal:     Cervical back: Normal range of motion.  Neurological:     Mental Status: She is alert.      UC Treatments / Results  Labs (all labs ordered are listed, but only abnormal results are displayed) Labs Reviewed - No data to display  EKG   Radiology No results found.  Procedures Procedures (including critical care time)  Medications Ordered in UC Medications - No data to display  Initial Impression / Assessment and Plan / UC Course  I have reviewed the triage vital signs and the nursing notes.  Pertinent labs & imaging results that were available during my care of the patient were reviewed by me and considered in my medical decision making (see chart for details).  Reports bump- possible pimple last week behind ear.   Patient's with symptoms of cellulitis of the right lower/ posterior ear and jawline.  Treatment with Bactrim DS and prednisone. Differential mastoiditis not probable because no recent ear infections or upper respiratory symptoms.  Exam of the ear is normal internal canal and tympanic membrane.  Denies any recent upper respiratory infections.     Final Clinical Impressions(s) / UC Diagnoses   Final diagnoses:  Cellulitis of face     Discharge Instructions      Take medications as ordered. If no improvement by the end of the week follow-up with PCP.  If swelling becomes worse and affects your breathing or swallowing please report to the ER.     ED Prescriptions     Medication Sig Dispense Auth. Provider   sulfamethoxazole-trimethoprim (BACTRIM DS) 800-160 MG tablet Take 1 tablet by  mouth 2 (two) times daily for 7 days. 14 tablet Angas Isabell, Linde Gillis, NP   predniSONE (DELTASONE) 20 MG tablet Take 1 tablet (20 mg total) by mouth daily with breakfast for 4 days. 4 tablet Bergen Melle, Linde Gillis, NP      PDMP not reviewed this encounter.   Nelda Marseille, NP 06/10/23 1737

## 2023-06-10 NOTE — Discharge Instructions (Addendum)
Take medications as ordered. If no improvement by the end of the week follow-up with PCP.  If swelling becomes worse and affects your breathing or swallowing please report to the ER.

## 2023-06-10 NOTE — ED Triage Notes (Signed)
Patient reports tender bump and swelling behind her right ear since Friday.  Patient denies fevers.

## 2023-06-16 DIAGNOSIS — R7303 Prediabetes: Secondary | ICD-10-CM | POA: Diagnosis not present

## 2023-07-07 ENCOUNTER — Telehealth: Payer: Self-pay | Admitting: Family

## 2023-07-07 DIAGNOSIS — B372 Candidiasis of skin and nail: Secondary | ICD-10-CM

## 2023-07-09 ENCOUNTER — Ambulatory Visit: Payer: No Typology Code available for payment source | Admitting: Family

## 2023-07-10 NOTE — Telephone Encounter (Signed)
Lvm for pt to give office a call back in regards to [facility] message

## 2023-07-11 NOTE — Telephone Encounter (Signed)
noted 

## 2023-07-11 NOTE — Telephone Encounter (Signed)
Patient just called back and said she doesn't have her dermatology appointment til September next year. She said she is on a waiting list.

## 2023-07-16 DIAGNOSIS — R7303 Prediabetes: Secondary | ICD-10-CM | POA: Diagnosis not present

## 2023-07-17 ENCOUNTER — Ambulatory Visit: Payer: No Typology Code available for payment source | Admitting: Family

## 2023-07-17 VITALS — BP 126/76 | HR 73 | Temp 98.2°F | Ht 69.0 in | Wt 254.0 lb

## 2023-07-17 DIAGNOSIS — E669 Obesity, unspecified: Secondary | ICD-10-CM | POA: Diagnosis not present

## 2023-07-17 DIAGNOSIS — L03211 Cellulitis of face: Secondary | ICD-10-CM

## 2023-07-17 DIAGNOSIS — Z6837 Body mass index (BMI) 37.0-37.9, adult: Secondary | ICD-10-CM

## 2023-07-17 MED ORDER — WEGOVY 0.5 MG/0.5ML ~~LOC~~ SOAJ
0.5000 mg | SUBCUTANEOUS | 2 refills | Status: DC
Start: 2023-07-17 — End: 2024-05-05

## 2023-07-17 NOTE — Progress Notes (Unsigned)
   Assessment & Plan:  There are no diagnoses linked to this encounter.   Return precautions given.   Risks, benefits, and alternatives of the medications and treatment plan prescribed today were discussed, and patient expressed understanding.   Education regarding symptom management and diagnosis given to patient on AVS either electronically or printed.  No follow-ups on file.  Rennie Plowman, FNP  Subjective:    Patient ID: Hannah Noble, female    DOB: [birth year], 52 y.o.   MRN: [id number]  CC: Hannah Noble is a 52 y.o. female who presents today for follow up.   HPI: Right ear swelling completely resolved.  Denies F, trouble swallowing, cough, swollen lymph nodes.      Follow up UC 06/10/23 right sided cellulitis and 'pimple' behind right ear Started on prednisone and bactrim    Allergies: Patient has no known allergies. Current Outpatient Medications on File Prior to Visit  Medication Sig Dispense Refill   albuterol (VENTOLIN HFA) 108 (90 Base) MCG/ACT inhaler INHALE 2 PUFFS INTO THE LUNGS EVERY 4 HOURS AS NEEDED FOR WHEEZE OR FOR SHORTNESS OF BREATH 6.7 each 2   Biotin 10 MG TABS Take by mouth.     cholecalciferol (VITAMIN D3) 25 MCG (1000 UNIT) tablet Take 1,000 Units by mouth daily.     fluconazole (DIFLUCAN) 150 MG tablet TAKE 1 TABLET BY MOUTH ONCE FOR 1 DOSE. IF SYMPTOMS PERSIST, MAY TAKE ONE TABLET 3 DAYS LATER. 2 tablet 2   fluticasone-salmeterol (WIXELA INHUB) 100-50 MCG/ACT AEPB Inhale 1 puff into the lungs 2 (two) times daily. 1 each 2   Multiple Vitamin (MULTIVITAMIN) tablet Take 1 tablet by mouth daily.     mupirocin ointment (BACTROBAN) 2 % APPLY TO AFFECTED AREA TWICE A DAY 22 g 2   Semaglutide-Weight Management (WEGOVY) 0.25 MG/0.5ML SOAJ Inject 0.25 mg into the skin once a week. 2 mL 2   vitamin B-12 (CYANOCOBALAMIN) 500 MCG tablet Take 500 mcg by mouth daily.     No current facility-administered medications on file prior to visit.    Review  of Systems    Objective:    BP 126/76   Pulse 73   Temp 98.2 F (36.8 C) (Oral)   Ht 5\' 9"  (1.753 m)   Wt 254 lb (115.2 kg)   LMP  (LMP Unknown)   SpO2 96%   BMI 37.51 kg/m  BP Readings from Last 3 Encounters:  07/17/23 126/76  06/10/23 (!) 141/83  05/24/23 122/80   Wt Readings from Last 3 Encounters:  07/17/23 254 lb (115.2 kg)  06/10/23 255 lb 15.3 oz (116.1 kg)  05/24/23 256 lb (116.1 kg)    Physical Exam

## 2023-07-18 DIAGNOSIS — L0390 Cellulitis, unspecified: Secondary | ICD-10-CM | POA: Insufficient documentation

## 2023-07-18 NOTE — Assessment & Plan Note (Signed)
Cellulitis has resolved.  Advised patient to monitor.

## 2023-07-18 NOTE — Assessment & Plan Note (Signed)
Fortunately patient has tolerated Wegovy so far.  We agreed to increase to 0.5 mg.  Increase every 4 weeks as needed until patient achieves 1 to 2 pound maximum weight loss per week

## 2023-07-31 ENCOUNTER — Telehealth: Payer: Self-pay | Admitting: Family

## 2023-07-31 DIAGNOSIS — L739 Follicular disorder, unspecified: Secondary | ICD-10-CM

## 2023-08-27 ENCOUNTER — Other Ambulatory Visit: Payer: Self-pay | Admitting: Family

## 2023-08-27 ENCOUNTER — Encounter: Payer: Self-pay | Admitting: Family

## 2023-08-27 DIAGNOSIS — L739 Follicular disorder, unspecified: Secondary | ICD-10-CM

## 2023-08-27 MED ORDER — MUPIROCIN 2 % EX OINT
TOPICAL_OINTMENT | Freq: Two times a day (BID) | CUTANEOUS | 2 refills | Status: DC
Start: 2023-08-27 — End: 2024-05-12

## 2023-08-27 NOTE — Telephone Encounter (Signed)
See my chart encounter.

## 2023-08-27 NOTE — Telephone Encounter (Signed)
Prescription Request  08/27/2023  LOV: 07/17/2023  What is the name of the medication or equipment? mupirocin  Have you contacted your pharmacy to request a refill? Yes   Which pharmacy would you like this sent to?  [id number] [facility] [place] [address] Phone: [phone] Fax: [phone]    Patient notified that their request is being sent to the clinical staff for review and that they should receive a response within 2 business days.   Please advise at Mobile [phone] (mobile)

## 2023-09-11 ENCOUNTER — Other Ambulatory Visit: Payer: Self-pay | Admitting: Family

## 2023-09-11 DIAGNOSIS — J4520 Mild intermittent asthma, uncomplicated: Secondary | ICD-10-CM

## 2023-09-29 ENCOUNTER — Other Ambulatory Visit: Payer: Self-pay | Admitting: Family

## 2023-09-29 DIAGNOSIS — B372 Candidiasis of skin and nail: Secondary | ICD-10-CM

## 2023-12-11 ENCOUNTER — Other Ambulatory Visit: Payer: Self-pay | Admitting: Family

## 2023-12-11 DIAGNOSIS — J4520 Mild intermittent asthma, uncomplicated: Secondary | ICD-10-CM

## 2023-12-25 ENCOUNTER — Other Ambulatory Visit: Payer: Self-pay | Admitting: Family

## 2023-12-25 DIAGNOSIS — B372 Candidiasis of skin and nail: Secondary | ICD-10-CM

## 2024-01-10 ENCOUNTER — Other Ambulatory Visit: Payer: Self-pay | Admitting: Family

## 2024-02-09 ENCOUNTER — Ambulatory Visit
Admission: RE | Admit: 2024-02-09 | Discharge: 2024-02-09 | Disposition: A | Discharge status: Home / Self Care | Source: Ambulatory Visit | Attending: Emergency Medicine | Admitting: Emergency Medicine

## 2024-02-09 ENCOUNTER — Ambulatory Visit (INDEPENDENT_AMBULATORY_CARE_PROVIDER_SITE_OTHER)

## 2024-02-09 VITALS — BP 130/76 | HR 70 | Temp 98.2°F | Resp 18

## 2024-02-09 DIAGNOSIS — M7731 Calcaneal spur, right foot: Secondary | ICD-10-CM | POA: Diagnosis not present

## 2024-02-09 DIAGNOSIS — M79671 Pain in right foot: Secondary | ICD-10-CM

## 2024-02-09 DIAGNOSIS — S9031XA Contusion of right foot, initial encounter: Secondary | ICD-10-CM

## 2024-02-09 DIAGNOSIS — M19071 Primary osteoarthritis, right ankle and foot: Secondary | ICD-10-CM | POA: Diagnosis not present

## 2024-02-09 DIAGNOSIS — M7661 Achilles tendinitis, right leg: Secondary | ICD-10-CM | POA: Diagnosis not present

## 2024-02-09 DIAGNOSIS — M7989 Other specified soft tissue disorders: Secondary | ICD-10-CM | POA: Diagnosis not present

## 2024-02-09 NOTE — Discharge Instructions (Addendum)
 Rest and elevate your foot.  Take ibuprofen as directed.  Follow-up with a podiatrist if your symptoms are not improving.

## 2024-02-09 NOTE — ED Provider Notes (Signed)
 [facility]    CSN: [id number] Arrival date & time: 02/09/24  1232      History   Chief Complaint Chief Complaint  Patient presents with   Foot Pain    Foot has been sore over the last couple weeks and I noticed it is swollen about a week ago and it does not seem to be resolving itself - Entered by patient    HPI Hannah Noble is a 53 y.o. female.  Patient presents with pain, swelling, bruising on the top of her right foot at the base of her middle toes.  Her symptoms have been present for 2 weeks.  She does not know of any injury.  No numbness, weakness, wounds.  No OTC medication taken today. The history is provided by the patient and medical records.    Past Medical History:  Diagnosis Date   Arthritis    Asthma    Iron deficiency anemia 03/23/2020    Patient Active Problem List   Diagnosis Date Noted   Cellulitis 07/18/2023   Polyp of descending colon    Dysmenorrhea 04/12/2022   B12 deficiency 02/14/2021   Iron deficiency anemia    Encounter for screening colonoscopy    Candidal intertrigo 10/28/2018   Tinea corporis 10/14/2018   Folliculitis 11/13/2016   Screening for breast cancer 09/30/2014   Routine general medical examination at a health care facility 07/15/2013   Obesity (BMI 30-39.9) 07/15/2013   Asthma in adult 11/21/2011   Cervical dysplasia 11/21/2011    Past Surgical History:  Procedure Laterality Date   BREAST BIOPSY Right 2010   needle, with Sankar   COLONOSCOPY WITH PROPOFOL  N/A 03/25/2020   Procedure: COLONOSCOPY WITH PROPOFOL ;  Surgeon: Irby Mannan, MD;  Location: [facility] ENDOSCOPY;  Service: Endoscopy;  Laterality: N/A;   COLONOSCOPY WITH PROPOFOL  N/A 07/03/2022   Procedure: COLONOSCOPY WITH PROPOFOL ;  Surgeon: Selena Daily, MD;  Location: [facility] ENDOSCOPY;  Service: Gastroenterology;  Laterality: N/A;   ESOPHAGOGASTRODUODENOSCOPY (EGD) WITH PROPOFOL  N/A 03/25/2020   Procedure: ESOPHAGOGASTRODUODENOSCOPY (EGD) WITH  PROPOFOL ;  Surgeon: Irby Mannan, MD;  Location: [facility] ENDOSCOPY;  Service: Endoscopy;  Laterality: N/A;   ESSURE TUBAL LIGATION  2012   Dr Annye Basque   GIVENS CAPSULE STUDY N/A 07/20/2020   Procedure: GIVENS CAPSULE STUDY;  Surgeon: Irby Mannan, MD;  Location: [facility] ENDOSCOPY;  Service: Endoscopy;  Laterality: N/A;   TUBAL LIGATION     VAGINAL DELIVERY     X2    OB History   No obstetric history on file.      Home Medications    Prior to Admission medications   Medication Sig Start Date End Date Taking? Authorizing Provider  albuterol  (VENTOLIN  HFA) 108 (90 Base) MCG/ACT inhaler INHALE 2 PUFFS INTO THE LUNGS EVERY 4 HOURS AS NEEDED FOR WHEEZING/SHORTNESS OF BREATH. 12/11/23   Calista Catching, FNP  Biotin 10 MG TABS Take by mouth.    [provider]  cholecalciferol (VITAMIN D3) 25 MCG (1000 UNIT) tablet Take 1,000 Units by mouth daily.    [provider]  fluconazole  (DIFLUCAN ) 150 MG tablet TAKE 1 TABLET BY MOUTH ONCE FOR 1 DOSE. IF SYMPTOMS PERSIST, MAY TAKE ONE TABLET 3 DAYS LATER. 12/26/23   Calista Catching, FNP  fluticasone -salmeterol (WIXELA INHUB) 100-50 MCG/ACT AEPB Inhale 1 puff into the lungs 2 (two) times daily. 11/28/22   Calista Catching, FNP  Multiple Vitamin (MULTIVITAMIN) tablet Take 1 tablet by mouth daily.    [provider]  mupirocin  ointment (BACTROBAN ) 2 % Apply topically 2 (two) times daily. 08/27/23   Calista Catching, FNP  Semaglutide -Weight Management (WEGOVY ) 0.5 MG/0.5ML SOAJ Inject 0.5 mg into the skin once a week. 07/17/23   Calista Catching, FNP  vitamin B-12 (CYANOCOBALAMIN ) 500 MCG tablet Take 500 mcg by mouth daily.    [provider]  Darlyne El 250-50 MCG/ACT AEPB TAKE 1 PUFF BY MOUTH TWICE A DAY 01/10/24   Calista Catching, FNP    Family History Family History  Problem Relation Age of Onset   COPD Father    Cancer Maternal Grandfather        melanoma   Cancer Paternal Grandmother         lung   Breast cancer Paternal Grandmother 36   Colon cancer Neg Hx    Thyroid  cancer Neg Hx     Social History Social History   Tobacco Use   Smoking status: Former    Current packs/day: 0.00    Types: Cigarettes    Quit date: 09/25/2012    Years since quitting: 11.3   Smokeless tobacco: Never  Vaping Use   Vaping status: Every Day  Substance Use Topics   Alcohol use: Yes    Comment: Rarely none last 24hrs   Drug use: No     Allergies   Patient has no known allergies.   Review of Systems Review of Systems  Constitutional:  Negative for chills and fever.  Musculoskeletal:  Positive for arthralgias and joint swelling. Negative for gait problem.  Skin:  Positive for color change. Negative for wound.  Neurological:  Negative for weakness and numbness.     Physical Exam Triage Vital Signs ED Triage Vitals [02/09/24 1249]  Encounter Vitals Group     BP 130/76     Systolic BP Percentile      Diastolic BP Percentile      Pulse Rate 70     Resp 18     Temp 98.2 F (36.8 C)     Temp src      SpO2 97 %     Weight      Height      Head Circumference      Peak Flow      Pain Score 1     Pain Loc      Pain Education      Exclude from Growth Chart    No data found.  Updated Vital Signs BP 130/76   Pulse 70   Temp 98.2 F (36.8 C)   Resp 18   LMP 01/12/2024   SpO2 97%   Visual Acuity Right Eye Distance:   Left Eye Distance:   Bilateral Distance:    Right Eye Near:   Left Eye Near:    Bilateral Near:     Physical Exam Constitutional:      General: She is not in acute distress. HENT:     Mouth/Throat:     Mouth: Mucous membranes are moist.  Cardiovascular:     Rate and Rhythm: Normal rate and regular rhythm.  Pulmonary:     Effort: Pulmonary effort is normal. No respiratory distress.  Musculoskeletal:        General: Swelling and tenderness present. No deformity. Normal range of motion.       Feet:     Comments: Mild edema and ecchymosis and  tenderness on dorsum of right foot at base of second, third, fourth toes.  2+ pedal pulse.  Sensation intact, strength 5/5, FROM.  Skin:    General: Skin is warm and dry.     Capillary Refill: Capillary refill takes less than 2 seconds.     Findings: Bruising present. No lesion.  Neurological:     General: No focal deficit present.     Mental Status: She is alert.     Sensory: No sensory deficit.     Motor: No weakness.     Gait: Gait normal.      UC Treatments / Results  Labs (all labs ordered are listed, but only abnormal results are displayed) Labs Reviewed - No data to display  EKG   Radiology DG Foot Complete Right Result Date: 02/09/2024 CLINICAL DATA:  Pain, swelling and bruising EXAM: RIGHT FOOT COMPLETE - 3+ VIEW COMPARISON:  None Available. FINDINGS: There is no evidence of fracture or dislocation. Mild degenerative change of the great toe metatarsal phalangeal joint. Plantar calcaneal spur and Achilles tendon enthesophyte. No erosions or periostitis. Slight midfoot soft tissue edema. No radiopaque foreign body or soft tissue gas. IMPRESSION: 1. Slight midfoot soft tissue edema. No acute osseous abnormality of the right foot. 2. Mild degenerative change of the great toe metatarsophalangeal joint. Electronically Signed   By: Chadwick Colonel M.D.   On: 02/09/2024 13:14    Procedures Procedures (including critical care time)  Medications Ordered in UC Medications - No data to display  Initial Impression / Assessment and Plan / UC Course  I have reviewed the triage vital signs and the nursing notes.  Pertinent labs & imaging results that were available during my care of the patient were reviewed by me and considered in my medical decision making (see chart for details).    Right foot pain and contusion.  X-ray shows no bony abnormality.  Discussed symptomatic treatment including rest, elevation, ibuprofen.  Instructed patient to follow-up with a podiatrist if her  symptoms are not improving.  Contact information for on-call podiatrist provided.  Education provided on foot contusion.  She agrees to plan of care.  Final Clinical Impressions(s) / UC Diagnoses   Final diagnoses:  Right foot pain  Contusion of right foot, initial encounter     Discharge Instructions      Rest and elevate your foot.  Take ibuprofen as directed.  Follow-up with a podiatrist if your symptoms are not improving.   ED Prescriptions   None    PDMP not reviewed this encounter.   Wellington Half, NP 02/09/24 1322

## 2024-02-09 NOTE — ED Triage Notes (Signed)
 Patient to Urgent Care with complaints of right sided foot pain/ swelling. Pain located on the ball of her foot. Denies any known injury.   Symptoms x2 weeks. Some days worse than others. Now having some swelling in her toes (2-3 days).  Taking tylenol.

## 2024-02-12 ENCOUNTER — Other Ambulatory Visit: Payer: Self-pay | Admitting: Family

## 2024-02-12 DIAGNOSIS — J4520 Mild intermittent asthma, uncomplicated: Secondary | ICD-10-CM

## 2024-04-07 ENCOUNTER — Other Ambulatory Visit: Payer: Self-pay | Admitting: Family

## 2024-04-07 DIAGNOSIS — B372 Candidiasis of skin and nail: Secondary | ICD-10-CM

## 2024-04-07 DIAGNOSIS — J4520 Mild intermittent asthma, uncomplicated: Secondary | ICD-10-CM

## 2024-04-08 ENCOUNTER — Telehealth: Payer: Self-pay

## 2024-04-08 NOTE — Telephone Encounter (Signed)
 Patient returned call to office. Patient states she has been dealing with an ongoing rash, that she has seen Rollene in the past for. Patient says she was referred to Dermatology and says that she is not able to get in to see them until September. Patient was advised she would need to be seen in office for reevaluation, as the has been an ongoing issue. Patient verbalized understanding. Patient says wanted to see if Rollene could send in the prescription. Patient is scheduled for Monday July 21st at 1:00 pm with Dr Onesimo. Verbalized understanding.

## 2024-04-08 NOTE — Telephone Encounter (Signed)
 Copied from CRM [id number]. Topic: General - Other >> Apr 08, 2024  3:01 PM Geroldine GRADE wrote: Reason for RMF:Ejupzwu is calling in to see if her Fluconazole  can be sent in to last until her appoint scheduled on the 11th of august

## 2024-04-08 NOTE — Telephone Encounter (Signed)
 Left message for patient to give our office a call back to discuss.   OK for E2C2 to gather information if the patient calls back. If relayed, please notify the office.

## 2024-04-08 NOTE — Telephone Encounter (Signed)
 Please call pt to clarify the reason for taking Diflucan .

## 2024-04-14 ENCOUNTER — Ambulatory Visit: Admitting: Internal Medicine

## 2024-05-05 ENCOUNTER — Ambulatory Visit: Admitting: Family

## 2024-05-05 ENCOUNTER — Encounter: Payer: Self-pay | Admitting: Family

## 2024-05-05 VITALS — BP 128/78 | HR 79 | Temp 98.0°F | Ht 69.0 in | Wt 237.8 lb

## 2024-05-05 DIAGNOSIS — E538 Deficiency of other specified B group vitamins: Secondary | ICD-10-CM

## 2024-05-05 DIAGNOSIS — M25561 Pain in right knee: Secondary | ICD-10-CM | POA: Insufficient documentation

## 2024-05-05 DIAGNOSIS — G8929 Other chronic pain: Secondary | ICD-10-CM

## 2024-05-05 DIAGNOSIS — B372 Candidiasis of skin and nail: Secondary | ICD-10-CM | POA: Diagnosis not present

## 2024-05-05 DIAGNOSIS — Z8639 Personal history of other endocrine, nutritional and metabolic disease: Secondary | ICD-10-CM

## 2024-05-05 DIAGNOSIS — M25562 Pain in left knee: Secondary | ICD-10-CM | POA: Diagnosis not present

## 2024-05-05 DIAGNOSIS — D509 Iron deficiency anemia, unspecified: Secondary | ICD-10-CM | POA: Diagnosis not present

## 2024-05-05 DIAGNOSIS — Z1231 Encounter for screening mammogram for malignant neoplasm of breast: Secondary | ICD-10-CM

## 2024-05-05 MED ORDER — FLUCONAZOLE 150 MG PO TABS: ORAL_TABLET | ORAL | 2 refills | Status: AC

## 2024-05-05 NOTE — Assessment & Plan Note (Signed)
 Suspected osteoarthritis. Discussed conservative treatment with continued exercise, ACE wrap for knee support and advise icing and compression for swelling. Consider referral to podiatry for foot issues and discuss potential referral to orthopedics for procedural interventions. Provided exercises for knee strengthening on AVS.Patient will let me know how she is doing.

## 2024-05-05 NOTE — Assessment & Plan Note (Signed)
 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 Provided on refill for Diflucan  to keep on hand. Advise using a hairdryer on a cool setting after showering and recommend powder to keep the area dry. Suggest wearing cotton clothing to wick moisture away. Discuss the upcoming dermatology appointment in September for further management.

## 2024-05-05 NOTE — Progress Notes (Signed)
 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 Provided on refill for Diflucan  to keep on hand. Advise using a hairdryer on a cool setting after showering and recommend powder to keep the area dry. Suggest wearing cotton clothing to wick moisture away. Discuss the upcoming dermatology appointment in September for further management.  Orders: -     Fluconazole ; TAKE 1 TABLET BY MOUTH ONCE FOR 1 DOSE. IF SYMPTOMS PERSIST, MAY TAKE ONE TABLET 3 DAYS LATER.  Dispense: 2 tablet; Refill: 2  B12 deficiency  History of vitamin D  deficiency -     VITAMIN D  25 Hydroxy (Vit-D Deficiency, Fractures); Future -     Hemoglobin A1c; Future -     CBC with Differential/Platelet; Future -     Comprehensive metabolic panel with GFR; Future -     Lipid panel; Future -     TSH; Future -     Fluconazole ; TAKE 1 TABLET BY MOUTH ONCE FOR 1 DOSE. IF SYMPTOMS PERSIST, MAY TAKE ONE TABLET 3 DAYS LATER.  Dispense: 2 tablet; Refill: 2  Encounter for screening mammogram for malignant neoplasm of breast -     3D Screening Mammogram, Left and Right; Future  Chronic pain of both knees Assessment & Plan: Suspected osteoarthritis. Discussed conservative treatment with continued exercise, ACE wrap for knee support and advise icing and compression for swelling. Consider referral to podiatry for foot issues and discuss potential referral to orthopedics for procedural interventions. Provided exercises for knee strengthening on AVS.Patient will let me know how she is doing.      Return precautions given.   Risks, benefits, and alternatives of the medications and treatment plan prescribed today were discussed, and patient expressed understanding.   Education regarding symptom management and diagnosis given to patient on AVS  either electronically or printed.  Return for Fasting labs in 2-3 weeks.  Rollene Northern, FNP  Subjective:    Patient ID: Hannah Noble, female    DOB: [birth year], 53 y.o.   MRN: [id number]  CC: Hannah Noble is a 53 y.o. female who presents today for follow up.   HPI: HPI Discussed the use of AI scribe software for clinical note transcription with the patient, who gave verbal consent to proceed.  History of Present Illness   Hannah Noble is a 53 year old female who presents for follow-up of recurrent yeast infections and arthritis.  She experiences recurrent yeast infections, particularly in the groin area and under the breasts, which are itchy and uncomfortable. She uses Diflucan  150 mg, taking one pill and then another three days later, which temporarily clears the infection, but it recurs approximately every couple of months. She also uses a blow dryer to keep the area dry. She reports experiencing sweating, particularly during hot flashes associated with menopause.   She reports a history of arthritis and experiences swelling and pain in her BL knees. She reports that her knees are often swollen and she experiences 'snap, crackling, popping' sounds.  She has recently lost weight intentionally with a low carb diet and a wellness program through her work. She has been successful in losing weight and maintaining a balanced diet.        Obesity   Weight management is ongoing with positive results. Wegovy  was discontinued due to insurance issues. Focus on a balanced diet  and exercise. Encourage a balanced diet and moderate exercise. Discuss potential re-enrollment in the [facility] program.        Allergies: Patient has no known allergies. Current Outpatient Medications on File Prior to Visit  Medication Sig Dispense Refill   albuterol  (VENTOLIN  HFA) 108 (90 Base) MCG/ACT inhaler INHALE 2 PUFFS INTO THE LUNGS EVERY 4 HOURS AS NEEDED FOR WHEEZING/SHORTNESS OF BREATH. 6.7  each 1   Biotin 10 MG TABS Take by mouth.     cholecalciferol (VITAMIN D3) 25 MCG (1000 UNIT) tablet Take 1,000 Units by mouth daily.     fluticasone -salmeterol (WIXELA INHUB) 100-50 MCG/ACT AEPB Inhale 1 puff into the lungs 2 (two) times daily. 1 each 2   Multiple Vitamin (MULTIVITAMIN) tablet Take 1 tablet by mouth daily.     mupirocin  ointment (BACTROBAN ) 2 % Apply topically 2 (two) times daily. 22 g 2   vitamin B-12 (CYANOCOBALAMIN ) 500 MCG tablet Take 500 mcg by mouth daily.     WIXELA INHUB 250-50 MCG/ACT AEPB TAKE 1 PUFF BY MOUTH TWICE A DAY 180 each 3   No current facility-administered medications on file prior to visit.    Review of Systems  Constitutional:  Negative for chills, fever and unexpected weight change.  HENT:  Negative for congestion.   Respiratory:  Negative for cough.   Cardiovascular:  Negative for chest pain, palpitations and leg swelling.  Gastrointestinal:  Negative for nausea and vomiting.  Musculoskeletal:  Negative for arthralgias and myalgias.  Skin:  Negative for rash.  Neurological:  Negative for headaches.  Hematological:  Negative for adenopathy.  Psychiatric/Behavioral:  Negative for confusion.       Objective:    BP 128/78   Pulse 79   Temp 98 F (36.7 C) (Oral)   Ht 5' 9 (1.753 m)   Wt 237 lb 12.8 oz (107.9 kg)   LMP  (LMP Unknown)   SpO2 98%   BMI 35.12 kg/m  BP Readings from Last 3 Encounters:  05/05/24 128/78  02/09/24 130/76  07/17/23 126/76   Wt Readings from Last 3 Encounters:  05/05/24 237 lb 12.8 oz (107.9 kg)  07/17/23 254 lb (115.2 kg)  06/10/23 255 lb 15.3 oz (116.1 kg)    Physical Exam Vitals reviewed.  Constitutional:      Appearance: She is well-developed.  Eyes:     Conjunctiva/sclera: Conjunctivae normal.  Cardiovascular:     Rate and Rhythm: Normal rate and regular rhythm.     Pulses: Normal pulses.     Heart sounds: Normal heart sounds.  Pulmonary:     Effort: Pulmonary effort is normal.     Breath  sounds: Normal breath sounds. No wheezing, rhonchi or rales.  Musculoskeletal:     Right knee: No swelling, deformity, effusion, erythema or bony tenderness. Normal range of motion.     Left knee: No swelling, deformity, effusion, erythema or bony tenderness. Normal range of motion.     Right lower leg: No edema.     Left lower leg: No edema.     Comments: Hypertrophied knee joints bilaterally.  Bilateral knees are symmetric. No effusion appreciated. No increase in warmth or erythema.  No calf tenderness of lower leg edema bilaterally.    Skin:    General: Skin is warm and dry.  Neurological:     Mental Status: She is alert.  Psychiatric:        Speech: Speech normal.        Behavior: Behavior normal.  Thought Content: Thought content normal.

## 2024-05-05 NOTE — Patient Instructions (Signed)
 Exercises for Chronic Knee Pain  Chronic knee pain is pain that lasts longer than 3 months. For most people with chronic knee pain, exercise and weight loss is an important part of treatment. Your health care provider may want you to focus on:  Making the muscles that support your knee stronger. This can take pressure off your knee and reduce pain.  Preventing knee stiffness.  How far you can move your knee, keeping it there or making it farther.  Losing weight (if this applies) to take pressure off your knee, lower your risk for injury, and make it easier for you to exercise.  Your provider will help you make an exercise program that fits your needs and physical abilities. Below are simple, low-impact exercises you can do at home. Ask your provider or physical therapist how often you should do your exercise program and how many times to repeat each exercise.  General safety tips    Get your provider's approval before doing any exercises.  Start slowly and stop any time you feel pain.  Do not exercise if your knee pain is flaring up.  Warm up first. Stretching a cold muscle can cause an injury. Do 5-10 minutes of easy movement or light stretching before beginning your exercises.  Do 5-10 minutes of low-impact activity (like walking or cycling) before starting strengthening exercises.  Contact your provider any time you have pain during or after exercising. Exercise can cause discomfort but should not be painful. It is normal to be a little stiff or sore after exercising.  Stretching and range-of-motion exercises  Front thigh stretch    Stand up straight and support your body by holding on to a chair or resting one hand on a Mastandrea.  With your legs straight and close together, bend one knee to lift your heel up toward your butt.  Using one hand for support, grab your ankle with your free hand.  Pull your foot up closer toward your butt to feel the stretch in front of your thigh.  Hold the stretch for 30  seconds.  Repeat __________ times. Complete this exercise __________ times a day.  Back thigh stretch    Sit on the floor with your back straight and your legs out straight in front of you.  Place the palms of your hands on the floor and slide them toward your feet as you bend at the hip.  Try to touch your nose to your knees and feel the stretch in the back of your thighs.  Hold for 30 seconds.  Repeat __________ times. Complete this exercise __________ times a day.  Calf stretch    Stand facing a Sheehy.  Place the palms of your hands flat against the Ghazi, arms extended, and lean slightly against the Muha.  Get into a lunge position with one leg bent at the knee and the other leg stretched out straight behind you.  Keep both feet facing the Ziesmer and increase the bend in your knee while keeping the heel of the other leg flat on the ground.  You should feel the stretch in your calf. Hold for 30 seconds.  Repeat __________ times. Complete this exercise __________ times a day.  Strengthening exercises  Straight leg lift    Lie on your back with one knee bent and the other leg out straight.  Slowly lift the straight leg without bending the knee.  Lift until your foot is about 12 inches (30 cm) off the floor.  Hold for  3-5 seconds and slowly lower your leg.  Repeat __________ times. Complete this exercise __________ times a day.  Single leg dip    Stand between two chairs and put both hands on the backs of the chairs for support.  Extend one leg out straight with your body weight resting on the heel of the standing leg.  Slowly bend your standing knee to dip your body to the level that is comfortable for you.  Hold for 3-5 seconds.  Repeat __________ times. Complete this exercise __________ times a day.  Hamstring curls    Stand straight, knees close together, facing the back of a chair.  Hold on to the back of a chair with both hands.  Keep one leg straight. Bend the other knee while bringing the heel up toward the butt  until the knee is bent at a 90-degree angle (right angle).  Hold for 3-5 seconds.  Repeat __________ times. Complete this exercise __________ times a day.  Neece squat    Stand straight with your back, hips, and head against a Rushing.  Step forward one foot at a time with your back still against the Spargur.  Your feet should be 2 feet (61 cm) from the Jeanpaul at shoulder width. Keeping your back, hips, and head against the Colligan, slide down the Brenner to as close to a sitting position as you can get.  Hold for 5-10 seconds, then slowly slide back up.  Repeat __________ times. Complete this exercise __________ times a day.  Step-ups    Stand in front of a sturdy platform or stool that is about 6 inches (15 cm) high.  Slowly step up with your left / right foot, keeping your knee in line with your hip and foot. Do not let your knee bend so far that you cannot see your toes. Hold on to a chair for balance, but do not use it for support.  Slowly unlock your knee and lower yourself to the starting position.  Repeat __________ times. Complete this exercise __________ times a day.  Contact a health care provider if:  Your exercises cause pain.  Your pain is worse after you exercise.  Your pain prevents you from doing your exercises.  This information is not intended to replace advice given to you by your health care provider. Make sure you discuss any questions you have with your health care provider.  Document Revised: 09/26/2022 Document Reviewed: 09/26/2022  Elsevier Patient Education  2024 [employer].

## 2024-05-10 ENCOUNTER — Other Ambulatory Visit: Payer: Self-pay | Admitting: Family

## 2024-05-10 DIAGNOSIS — L739 Follicular disorder, unspecified: Secondary | ICD-10-CM

## 2024-05-29 ENCOUNTER — Ambulatory Visit: Payer: No Typology Code available for payment source | Admitting: Dermatology

## 2024-06-02 ENCOUNTER — Encounter: Admitting: Family

## 2024-06-11 ENCOUNTER — Other Ambulatory Visit: Payer: Self-pay | Admitting: Family

## 2024-06-11 DIAGNOSIS — J4520 Mild intermittent asthma, uncomplicated: Secondary | ICD-10-CM

## 2024-06-16 ENCOUNTER — Encounter: Payer: Self-pay | Admitting: Dermatology

## 2024-06-16 ENCOUNTER — Ambulatory Visit: Admitting: Dermatology

## 2024-06-16 DIAGNOSIS — Z7189 Other specified counseling: Secondary | ICD-10-CM

## 2024-06-16 DIAGNOSIS — L732 Hidradenitis suppurativa: Secondary | ICD-10-CM | POA: Diagnosis not present

## 2024-06-16 DIAGNOSIS — Z79899 Other long term (current) drug therapy: Secondary | ICD-10-CM

## 2024-06-16 DIAGNOSIS — L905 Scar conditions and fibrosis of skin: Secondary | ICD-10-CM

## 2024-06-16 MED ORDER — DOXYCYCLINE MONOHYDRATE 50 MG PO TABS: 50.0000 mg | ORAL_TABLET | Freq: Every day | ORAL | 3 refills | Status: AC

## 2024-06-16 NOTE — Progress Notes (Signed)
   New Patient Visit   Subjective  Hannah Noble is a 53 y.o. female who presents for the following: Rash. Gets hair bumps under arms. Has bump at mid chest now. Dx with folliculitis by PCP, was given Mupirocin . Flares at least once a month  C/O rash at groin area that comes and goes. PCP prescribes Fluconazole  150 mg tablets.  The following portions of the chart were reviewed this encounter and updated as appropriate: medications, allergies, medical history  Review of Systems:  No other skin or systemic complaints except as noted in HPI or Assessment and Plan.  Objective  Well appearing patient in no apparent distress; mood and affect are within normal limits.  A focused examination was performed of the following areas: Axilla, chest, groin area  Relevant exam findings are noted in the Assessment and Plan.         Assessment & Plan   ENCOUNTER FOR LONG-TERM (CURRENT) USE OF HIGH-RISK MEDICATION   Related Procedures Hepatitis B surface antibody,qualitative Hepatitis B surface antigen Hepatitis C antibody HIV Antibody (routine testing w rflx) QuantiFERON-TB Gold Plus HIDRADENITIS SUPPURATIVA   Related Procedures Hepatitis B surface antibody,qualitative Hepatitis B surface antigen Hepatitis C antibody HIV Antibody (routine testing w rflx) QuantiFERON-TB Gold Plus  HIDRADENITIS SUPPURATIVA Exam: violaceous plaques with scarring at groin, axillae  Chronic and persistent condition with duration or expected duration over one year. Condition is symptomatic/ bothersome to patient. Not currently at goal.  Hx of Essure tubal ligation.   Hidradenitis Suppurativa is a chronic; persistent; non-curable, but treatable condition due to abnormal inflamed sweat glands in the body folds (axilla, inframammary, groin, medial thighs), causing recurrent painful draining cysts and scarring. It can be associated with severe scarring acne and cysts; also abscesses and scarring of  scalp. The goal is control and prevention of flares, as it is not curable. Scars are permanent and can be thickened. Treatment may include daily use of topical medication and oral antibiotics.  Oral isotretinoin may also be helpful.  For some cases, Humira or Cosentyx (biologic injections) may be prescribed to decrease the inflammatory process and prevent flares.  When indicated, inflamed cysts may also be treated surgically.  Treatment Plan: Start Doxycycline  50 mg daily with food.  Doxycycline  should be taken with food to prevent nausea. Do not lay down for 30 minutes after taking. Be cautious with sun exposure and use good sun protection while on this medication. Pregnant women should not take this medication.   Plan to start Bimzelx pending lab results and insurance approval.   Return for HS Recheck in 4-6 weeks.  I, Jill Parcell, CMA, am acting as scribe for Hannah Rhyme, MD.   Documentation: I have reviewed the above documentation for accuracy and completeness, and I agree with the above.  Hannah Rhyme, MD

## 2024-06-16 NOTE — Patient Instructions (Addendum)
 Start Doxycycline  50 mg daily with food.  Doxycycline  should be taken with food to prevent nausea. Do not lay down for 30 minutes after taking. Be cautious with sun exposure and use good sun protection while on this medication. Pregnant women should not take this medication.    Hidradenitis Suppurativa is a chronic; persistent; non-curable, but treatable condition due to abnormal inflamed sweat glands in the body folds (axilla, inframammary, groin, medial thighs), causing recurrent painful draining cysts and scarring. It can be associated with severe scarring acne and cysts; also abscesses and scarring of scalp. The goal is control and prevention of flares, as it is not curable. Scars are permanent and can be thickened. Treatment may include daily use of topical medication and oral antibiotics.  Oral isotretinoin may also be helpful.  For some cases, Humira or Cosentyx or Bimzelx (biologic injections) may be prescribed to decrease the inflammatory process and prevent flares.  When indicated, inflamed cysts may also be treated surgically.  Reviewed risks of biologics including immunosuppression, infections (i.e. TB reactivation), injection site reaction, and failure to improve condition. Goal is control of skin condition, not cure.  Some older biologics such as Humira and Enbrel may slightly increase risk of malignancy and may worsen congestive heart failure.  Taltz, Cosentyx, and Bimzelx may cause inflammatory bowel disease to flare or may increase incidence of yeast infections. Skyrizi, Tremfya, and Stelara may also slightly increase risk of infection. The use of biologics requires long term medication management, including periodic office visits, annual TB screening test and monitoring of blood work.    Due to recent changes in healthcare laws, you may see results of your pathology and/or laboratory studies on MyChart before the doctors have had a chance to review them. We understand that in some cases  there may be results that are confusing or concerning to you. Please understand that not all results are received at the same time and often the doctors may need to interpret multiple results in order to provide you with the best plan of care or course of treatment. Therefore, we ask that you please give us  2 business days to thoroughly review all your results before contacting the office for clarification. Should we see a critical lab result, you will be contacted sooner.   If You Need Anything After Your Visit  If you have any questions or concerns for your doctor, please call our main line at [phone] and press option 4 to reach your doctor's medical assistant. If no one answers, please leave a voicemail as directed and we will return your call as soon as possible. Messages left after 4 pm will be answered the following business day.   You may also send us  a message via MyChart. We typically respond to MyChart messages within 1-2 business days.  For prescription refills, please ask your pharmacy to contact our office. Our fax number is [phone].  If you have an urgent issue when the clinic is closed that cannot wait until the next business day, you can page your doctor at the number below.    Please note that while we do our best to be available for urgent issues outside of office hours, we are not available 24/7.   If you have an urgent issue and are unable to reach us , you may choose to seek medical care at your doctor's office, retail clinic, urgent care center, or emergency room.  If you have a medical emergency, please immediately call 911 or go to the  emergency department.  Pager Numbers  - Dr. Hester: [phone]  - Dr. Jackquline: [phone]  - Dr. Claudene: [phone]   - Dr. Raymund: [phone]  In the event of inclement weather, please call our main line at [phone] for an update on the status of any delays or closures.  Dermatology Medication Tips: Please  keep the boxes that topical medications come in in order to help keep track of the instructions about where and how to use these. Pharmacies typically print the medication instructions only on the boxes and not directly on the medication tubes.   If your medication is too expensive, please contact our office at [phone] option 4 or send us  a message through MyChart.   We are unable to tell what your co-pay for medications will be in advance as this is different depending on your insurance coverage. However, we may be able to find a substitute medication at lower cost or fill out paperwork to get insurance to cover a needed medication.   If a prior authorization is required to get your medication covered by your insurance company, please allow us  1-2 business days to complete this process.  Drug prices often vary depending on where the prescription is filled and some pharmacies may offer cheaper prices.  The website www.goodrx.com contains coupons for medications through different pharmacies. The prices here do not account for what the cost may be with help from insurance (it may be cheaper with your insurance), but the website can give you the price if you did not use any insurance.  - You can print the associated coupon and take it with your prescription to the pharmacy.  - You may also stop by our office during regular business hours and pick up a GoodRx coupon card.  - If you need your prescription sent electronically to a different pharmacy, notify our office through [facility] or by phone at [phone] option 4.     Si Usted Necesita Algo Despus de Su Visita  Tambin puede enviarnos un mensaje a travs de Clinical cytogeneticist. Por lo general respondemos a los mensajes de MyChart en el transcurso de 1 a 2 das hbiles.  Para renovar recetas, por favor pida a su farmacia que se ponga en contacto con nuestra oficina. Randi lakes de fax es [place] [phone].  Si tiene un asunto urgente  cuando la clnica est cerrada y que no puede esperar hasta el siguiente da hbil, puede llamar/localizar a su doctor(a) al nmero que aparece a continuacin.   Por favor, tenga en cuenta que aunque hacemos todo lo posible para estar disponibles para asuntos urgentes fuera del horario de [place], no estamos disponibles las 24 horas del da, los 7 [address]  [address] de la [place].   Si tiene un problema urgente y no puede comunicarse con nosotros, puede optar por buscar atencin mdica  en el consultorio de su doctor(a), en una clnica privada, en un centro de atencin urgente o en una sala de emergencias.  Si tiene Engineer, drilling, por favor llame inmediatamente al 911 o vaya a la sala de emergencias.  Nmeros de bper  - Dr. Hester: [phone]  - Dra. Jackquline: [phone]  - Dr. Claudene: [phone]  - Dra. Kitts: [phone]  En caso de inclemencias del [place], por favor llame a nuestra lnea principal al [phone] para una actualizacin sobre el estado de cualquier retraso o cierre.  Consejos para la medicacin en dermatologa: Por favor, guarde las cajas en las que vienen los medicamentos de uso tpico para  ayudarle a seguir las [employer] dnde y cmo usarlos. Las farmacias generalmente imprimen las instrucciones del medicamento slo en las cajas y no directamente en los tubos del [place].   Si su medicamento es muy caro, por favor, pngase en contacto con landry rieger llamando al [phone] y presione la opcin 4 o envenos un mensaje a travs de Clinical cytogeneticist.   No podemos decirle cul ser su copago por los medicamentos por adelantado ya que esto es diferente dependiendo de la cobertura de su seguro. Sin embargo, es posible que podamos encontrar un medicamento sustituto a Audiological scientist un formulario para que el seguro cubra el medicamento que se considera necesario.   Si se requiere una autorizacin previa para que su compaa de seguros malta su medicamento, por  favor permtanos de 1 a 2 das hbiles para completar este proceso.  Los precios de los medicamentos varan con frecuencia dependiendo del Environmental consultant de dnde se surte la receta y alguna farmacias pueden ofrecer precios ms baratos.  El sitio web www.goodrx.com tiene cupones para medicamentos de Health and safety inspector. Los precios aqu no tienen en cuenta lo que podra costar con la ayuda del seguro (puede ser ms barato con su seguro), pero el sitio web puede darle el precio si no utiliz Tourist information centre manager.  - Puede imprimir el cupn correspondiente y llevarlo con su receta a la farmacia.  - Tambin puede pasar por nuestra oficina durante el horario de atencin regular y Education officer, museum una tarjeta de cupones de GoodRx.  - Si necesita que su receta se enve electrnicamente a una farmacia diferente, informe a nuestra oficina a travs de MyChart de [place] o por telfono llamando al [phone] y presione la opcin 4.

## 2024-07-14 ENCOUNTER — Ambulatory Visit (INDEPENDENT_AMBULATORY_CARE_PROVIDER_SITE_OTHER): Admitting: Family

## 2024-07-14 ENCOUNTER — Encounter: Payer: Self-pay | Admitting: Family

## 2024-07-14 VITALS — BP 124/76 | HR 76 | Temp 98.2°F | Ht 69.0 in | Wt 245.8 lb

## 2024-07-14 DIAGNOSIS — Z122 Encounter for screening for malignant neoplasm of respiratory organs: Secondary | ICD-10-CM | POA: Diagnosis not present

## 2024-07-14 DIAGNOSIS — B372 Candidiasis of skin and nail: Secondary | ICD-10-CM | POA: Diagnosis not present

## 2024-07-14 DIAGNOSIS — Z0000 Encounter for general adult medical examination without abnormal findings: Secondary | ICD-10-CM

## 2024-07-14 MED ORDER — CLOTRIMAZOLE 1 % EX CREA: 1.0000 | TOPICAL_CREAM | Freq: Two times a day (BID) | CUTANEOUS | 3 refills | Status: AC

## 2024-07-14 NOTE — Assessment & Plan Note (Signed)
 Recurrent intertrigo candidiasis . Continue prn use of clotrimazole  , fluconazole  as she is doing. Referral placed to [facility] for annual skin exam and surveillance of intertrigo candidiasis.

## 2024-07-14 NOTE — Patient Instructions (Addendum)
 As discussed, an annual skin exam is very important.  Please call and make an appointment to be evaluated.  Referral placed to [facility] [phone])    Please call  and schedule your 3D mammogram and /or bone density scan as we discussed.   [facility]  ( new location in 2023)  [address], [place], [place] [address]  [place], [place]  [phone]    Health Maintenance for Postmenopausal Women Menopause is a normal process in which your ability to get pregnant comes to an end. This process happens slowly over many months or years, usually between the ages of 16 and 26. Menopause is complete when you have missed your menstrual period for 12 months. It is important to talk with your health care provider about some of the most common conditions that affect women after menopause (postmenopausal women). These include heart disease, cancer, and bone loss (osteoporosis). Adopting a healthy lifestyle and getting preventive care can help to promote your health and wellness. The actions you take can also lower your chances of developing some of these common conditions. What are the signs and symptoms of menopause? During menopause, you may have the following symptoms: Hot flashes. These can be moderate or severe. Night sweats. Decrease in sex drive. Mood swings. Headaches. Tiredness (fatigue). Irritability. Memory problems. Problems falling asleep or staying asleep. Talk with your health care provider about treatment options for your symptoms. Do I need hormone replacement therapy? Hormone replacement therapy is effective in treating symptoms that are caused by menopause, such as hot flashes and night sweats. Hormone replacement carries certain risks, especially as you become older. If you are thinking about using estrogen or estrogen with progestin, discuss the benefits and risks with your health care provider. How can I reduce my risk for heart disease  and stroke? The risk of heart disease, heart attack, and stroke increases as you age. One of the causes may be a change in the body's hormones during menopause. This can affect how your body uses dietary fats, triglycerides, and cholesterol. Heart attack and stroke are medical emergencies. There are many things that you can do to help prevent heart disease and stroke. Watch your blood pressure High blood pressure causes heart disease and increases the risk of stroke. This is more likely to develop in people who have high blood pressure readings or are overweight. Have your blood pressure checked: Every 3-5 years if you are 51-42 years of age. Every year if you are 68 years old or older. Eat a healthy diet  Eat a diet that includes plenty of vegetables, fruits, low-fat dairy products, and lean protein. Do not eat a lot of foods that are high in solid fats, added sugars, or sodium. Get regular exercise Get regular exercise. This is one of the most important things you can do for your health. Most adults should: Try to exercise for at least 150 minutes each week. The exercise should increase your heart rate and make you sweat (moderate-intensity exercise). Try to do strengthening exercises at least twice each week. Do these in addition to the moderate-intensity exercise. Spend less time sitting. Even light physical activity can be beneficial. Other tips Work with your health care provider to achieve or maintain a healthy weight. Do not use any products that contain nicotine or tobacco. These products include cigarettes, chewing tobacco, and vaping devices, such as e-cigarettes. If you need help quitting, ask your health care provider. Know your numbers. Ask  your health care provider to check your cholesterol and your blood sugar (glucose). Continue to have your blood tested as directed by your health care provider. Do I need screening for cancer? Depending on your health history and family history,  you may need to have cancer screenings at different stages of your life. This may include screening for: Breast cancer. Cervical cancer. Lung cancer. Colorectal cancer. What is my risk for osteoporosis? After menopause, you may be at increased risk for osteoporosis. Osteoporosis is a condition in which bone destruction happens more quickly than new bone creation. To help prevent osteoporosis or the bone fractures that can happen because of osteoporosis, you may take the following actions: If you are 68-75 years old, get at least 1,000 mg of calcium  and at least 600 international units (IU) of vitamin D  per day. If you are older than age 65 but younger than age 85, get at least 1,200 mg of calcium  and at least 600 international units (IU) of vitamin D  per day. If you are older than age 72, get at least 1,200 mg of calcium  and at least 800 international units (IU) of vitamin D  per day. Smoking and drinking excessive alcohol increase the risk of osteoporosis. Eat foods that are rich in calcium  and vitamin D , and do weight-bearing exercises several times each week as directed by your health care provider. How does menopause affect my mental health? Depression may occur at any age, but it is more common as you become older. Common symptoms of depression include: Feeling depressed. Changes in sleep patterns. Changes in appetite or eating patterns. Feeling an overall lack of motivation or enjoyment of activities that you previously enjoyed. Frequent crying spells. Talk with your health care provider if you think that you are experiencing any of these symptoms. General instructions See your health care provider for regular wellness exams and vaccines. This may include: Scheduling regular health, dental, and eye exams. Getting and maintaining your vaccines. These include: Influenza vaccine. Get this vaccine each year before the flu season begins. Pneumonia vaccine. Shingles vaccine. Tetanus,  diphtheria, and pertussis (Tdap) booster vaccine. Your health care provider may also recommend other immunizations. Tell your health care provider if you have ever been abused or do not feel safe at home. Summary Menopause is a normal process in which your ability to get pregnant comes to an end. This condition causes hot flashes, night sweats, decreased interest in sex, mood swings, headaches, or lack of sleep. Treatment for this condition may include hormone replacement therapy. Take actions to keep yourself healthy, including exercising regularly, eating a healthy diet, watching your weight, and checking your blood pressure and blood sugar levels. Get screened for cancer and depression. Make sure that you are up to date with all your vaccines. This information is not intended to replace advice given to you by your health care provider. Make sure you discuss any questions you have with your health care provider. Document Revised: 01/31/2021 Document Reviewed: 01/31/2021 Elsevier Patient Education  2024 [employer].

## 2024-07-14 NOTE — Assessment & Plan Note (Addendum)
 Deferred CBE due to patient preference. She will schedule mammogram. Encouraged continued exercise. H/o smoking; referral placed to lung cancer screening  program.

## 2024-07-14 NOTE — Progress Notes (Signed)
 Assessment & Plan:  Routine general medical examination at a health care facility Assessment & Plan: Deferred CBE due to patient preference. She will schedule mammogram. Encouraged continued exercise. H/o smoking; referral placed to lung cancer screening  program.   Orders: -     Ambulatory Referral for Lung Cancer Scre  Candidal intertrigo Assessment & Plan: Recurrent intertrigo candidiasis . Continue prn use of clotrimazole  , fluconazole  as she is doing. Referral placed to [facility] for annual skin exam and surveillance of intertrigo candidiasis.   Orders: -     Ambulatory referral to Dermatology -     Clotrimazole ; Apply 1 Application topically 2 (two) times daily.  Dispense: 30 g; Refill: 3  Screening for lung cancer -     Ambulatory Referral for Lung Cancer Scre     Return precautions given.   Risks, benefits, and alternatives of the medications and treatment plan prescribed today were discussed, and patient expressed understanding.   Education regarding symptom management and diagnosis given to patient on AVS either electronically or printed.  Return in about 6 months (around 01/12/2025) for Fasting labs in 2-3 weeks.  Rollene Northern, FNP  Subjective:    Patient ID: Hannah Noble, female    DOB: 1971-06-19, 53 y.o.   MRN: [id number]  CC: Hannah Noble is a 53 y.o. female who presents today for physical exam.    HPI: Feels well today No new complaints  She is following with dermatology and considering changing  practices. She has recurrent candida intertrigo; diflucan  resolves symptom.      Colorectal Cancer Screening: UTD , repeat in 5 years, last 07/03/22 Breast Cancer Screening: Mammogram due Cervical Cancer Screening: UTD, 04/12/22 NILM, neg HPV Bone Health screening/DEXA for 65+: No increased fracture risk. Defer screening at this time.  Lung Cancer Screening: Doesn't have 20 year pack year history and age > 108 years yo 89 years        Tetanus - UTD        Pneumococcal - Candidate for.  Exercise: Gets regular exercise, walking daily.   Alcohol use:  Occassional Smoking/tobacco use: former smoker.    Health Maintenance  Topic Date Due   Hepatitis B Vaccine (1 of 3 - 19+ 3-dose series) Never done   COVID-19 Vaccine (6 - 2025-26 season) 05/26/2024   Zoster (Shingles) Vaccine (1 of 2) 08/05/2024*   Flu Shot  12/23/2024*   Pneumococcal Vaccine for age over 30 (2 of 2 - PCV) 05/05/2025*   Breast Cancer Screening  05/02/2025   Pap with HPV screening  04/13/2027   Colon Cancer Screening  07/04/2027   DTaP/Tdap/Td vaccine (2 - Td or Tdap) 11/17/2029   Hepatitis C Screening  Completed   HIV Screening  Completed   HPV Vaccine  Aged Out   Meningitis B Vaccine  Aged Out  *Topic was postponed. The date shown is not the original due date.    ALLERGIES: Patient has no known allergies.  Current Outpatient Medications on File Prior to Visit  Medication Sig Dispense Refill   albuterol  (VENTOLIN  HFA) 108 (90 Base) MCG/ACT inhaler INHALE 2 PUFFS INTO THE LUNGS EVERY 4 HOURS AS NEEDED FOR WHEEZING/SHORTNESS OF BREATH. 6.7 each 1   Biotin 10 MG TABS Take by mouth.     cholecalciferol (VITAMIN D3) 25 MCG (1000 UNIT) tablet Take 1,000 Units by mouth daily.     doxycycline  (ADOXA) 50 MG tablet Take 1 tablet (50 mg total) by mouth daily. Take with food 30  tablet 3   fluconazole  (DIFLUCAN ) 150 MG tablet TAKE 1 TABLET BY MOUTH ONCE FOR 1 DOSE. IF SYMPTOMS PERSIST, MAY TAKE ONE TABLET 3 DAYS LATER. 2 tablet 2   fluticasone -salmeterol (WIXELA INHUB) 100-50 MCG/ACT AEPB Inhale 1 puff into the lungs 2 (two) times daily. 1 each 2   Multiple Vitamin (MULTIVITAMIN) tablet Take 1 tablet by mouth daily.     mupirocin  ointment (BACTROBAN ) 2 % APPLY TOPICALLY TWICE A DAY 22 g 2   vitamin B-12 (CYANOCOBALAMIN ) 500 MCG tablet Take 500 mcg by mouth daily.     WIXELA INHUB 250-50 MCG/ACT AEPB TAKE 1 PUFF BY MOUTH TWICE A DAY 180 each 3   No current  facility-administered medications on file prior to visit.    Review of Systems  Constitutional:  Negative for chills and fever.  Respiratory:  Negative for cough.   Cardiovascular:  Negative for chest pain and palpitations.  Gastrointestinal:  Negative for nausea and vomiting.      Objective:    BP 124/76   Pulse 76   Temp 98.2 F (36.8 C) (Oral)   Ht 5' 9 (1.753 m)   Wt 245 lb 12.8 oz (111.5 kg)   LMP  (LMP Unknown)   SpO2 99%   BMI 36.30 kg/m   BP Readings from Last 3 Encounters:  07/14/24 124/76  05/05/24 128/78  02/09/24 130/76   Wt Readings from Last 3 Encounters:  07/14/24 245 lb 12.8 oz (111.5 kg)  05/05/24 237 lb 12.8 oz (107.9 kg)  07/17/23 254 lb (115.2 kg)    Physical Exam Vitals reviewed.  Constitutional:      Appearance: Normal appearance. She is well-developed.  Eyes:     Conjunctiva/sclera: Conjunctivae normal.  Neck:     Thyroid : No thyroid  mass or thyromegaly.  Cardiovascular:     Rate and Rhythm: Normal rate and regular rhythm.     Pulses: Normal pulses.     Heart sounds: Normal heart sounds.  Pulmonary:     Effort: Pulmonary effort is normal.     Breath sounds: Normal breath sounds. No wheezing, rhonchi or rales.  Abdominal:     General: Bowel sounds are normal. There is no distension.     Palpations: Abdomen is soft. Abdomen is not rigid. There is no fluid wave or mass.     Tenderness: There is no abdominal tenderness. There is no guarding or rebound.  Lymphadenopathy:     Head:     Right side of head: No submental, submandibular, tonsillar, preauricular, posterior auricular or occipital adenopathy.     Left side of head: No submental, submandibular, tonsillar, preauricular, posterior auricular or occipital adenopathy.     Cervical: No cervical adenopathy.  Skin:    General: Skin is warm and dry.  Neurological:     Mental Status: She is alert.  Psychiatric:        Speech: Speech normal.        Behavior: Behavior normal.         Thought Content: Thought content normal.

## 2024-07-16 ENCOUNTER — Ambulatory Visit: Admitting: Dermatology

## 2024-07-23 ENCOUNTER — Telehealth: Payer: Self-pay

## 2024-07-23 NOTE — Telephone Encounter (Signed)
 Spoke to pt she stated that she would just like to cancel lab appt altogether I went to cancel but she was not on the schedule

## 2024-07-23 NOTE — Telephone Encounter (Signed)
 Copied from CRM #[id number]. Topic: General - Other >> Jul 23, 2024 10:50 AM Carlyon D wrote: Reason for CRM: Pt is reaching out to billing in regards to her labs they are suppose to be part of her physical and wants to make sure there will be no extra charge or be billed. Pt will call back if she's going to cancel lab appt for tomorrow. >> Jul 23, 2024 11:08 AM Adelita E wrote: Patient called the billing department and then was advised to call her insurance. Patient stated that she is needing the CPT code for the labs and questioning how she will be billed for these. Please call patient and advise of amount.

## 2024-07-23 NOTE — Telephone Encounter (Signed)
 noted

## 2024-07-24 ENCOUNTER — Other Ambulatory Visit

## 2024-08-13 ENCOUNTER — Other Ambulatory Visit: Payer: Self-pay | Admitting: Family

## 2024-08-13 DIAGNOSIS — J4520 Mild intermittent asthma, uncomplicated: Secondary | ICD-10-CM

## 2024-08-14 ENCOUNTER — Encounter: Payer: Self-pay | Admitting: Family

## 2024-08-19 ENCOUNTER — Telehealth: Payer: Self-pay | Admitting: Family

## 2024-08-19 NOTE — Telephone Encounter (Signed)
 I received notice from [facility] that there appears to be an insurance issue not resolved.  Patient has been unable to schedule appointment.  Patient is changing from [facility], Dr. Bolling to [facility].  I will place this letter on rasheedahs desk   Please address and update pt

## 2024-08-28 ENCOUNTER — Ambulatory Visit
Admission: RE | Admit: 2024-08-28 | Discharge: 2024-08-28 | Disposition: A | Source: Ambulatory Visit | Attending: Family

## 2024-08-28 DIAGNOSIS — Z1231 Encounter for screening mammogram for malignant neoplasm of breast: Secondary | ICD-10-CM | POA: Insufficient documentation

## 2024-10-09 ENCOUNTER — Ambulatory Visit: Admitting: Dermatology

## 2024-10-16 ENCOUNTER — Other Ambulatory Visit: Payer: Self-pay | Admitting: Family

## 2024-10-16 DIAGNOSIS — J4520 Mild intermittent asthma, uncomplicated: Secondary | ICD-10-CM

## 2024-10-29 ENCOUNTER — Ambulatory Visit: Admitting: Dermatology

## 2024-10-30 ENCOUNTER — Ambulatory Visit: Admitting: Dermatology

## 2024-11-13 ENCOUNTER — Ambulatory Visit: Admitting: Dermatology
